# Patient Record
Sex: Female | Born: 1965 | Race: White | Hispanic: No | Marital: Married | Smoking: Current every day smoker
Health system: Southern US, Community
[De-identification: ages and names within clinical notes are randomized; demographics above are authoritative.]

## PROBLEM LIST (undated history)

## (undated) DIAGNOSIS — T8859XA Other complications of anesthesia, initial encounter: Secondary | ICD-10-CM

## (undated) DIAGNOSIS — Z9889 Other specified postprocedural states: Secondary | ICD-10-CM

## (undated) DIAGNOSIS — R112 Nausea with vomiting, unspecified: Secondary | ICD-10-CM

## (undated) DIAGNOSIS — T4145XA Adverse effect of unspecified anesthetic, initial encounter: Secondary | ICD-10-CM

## (undated) DIAGNOSIS — K5090 Crohn's disease, unspecified, without complications: Secondary | ICD-10-CM

## (undated) DIAGNOSIS — F909 Attention-deficit hyperactivity disorder, unspecified type: Secondary | ICD-10-CM

## (undated) HISTORY — PX: ABDOMINAL SURGERY: SHX537

---
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
## 2004-02-14 ENCOUNTER — Encounter: Admission: RE | Admit: 2004-02-14 | Discharge: 2004-02-14 | Payer: Self-pay | Admitting: Gastroenterology

## 2004-08-12 ENCOUNTER — Inpatient Hospital Stay (HOSPITAL_COMMUNITY): Admission: AD | Admit: 2004-08-12 | Discharge: 2004-08-15 | Payer: Self-pay | Admitting: Gastroenterology

## 2004-09-14 ENCOUNTER — Ambulatory Visit (HOSPITAL_COMMUNITY): Admission: RE | Admit: 2004-09-14 | Discharge: 2004-09-14 | Payer: Self-pay | Admitting: Gastroenterology

## 2004-09-25 ENCOUNTER — Ambulatory Visit (HOSPITAL_COMMUNITY): Admission: RE | Admit: 2004-09-25 | Discharge: 2004-09-25 | Payer: Self-pay | Admitting: Gastroenterology

## 2004-10-09 ENCOUNTER — Encounter (HOSPITAL_COMMUNITY): Admission: RE | Admit: 2004-10-09 | Discharge: 2005-01-07 | Payer: Self-pay | Admitting: Gastroenterology

## 2005-01-19 ENCOUNTER — Inpatient Hospital Stay (HOSPITAL_COMMUNITY): Admission: RE | Admit: 2005-01-19 | Discharge: 2005-01-25 | Payer: Self-pay | Admitting: General Surgery

## 2019-04-24 DIAGNOSIS — K56609 Unspecified intestinal obstruction, unspecified as to partial versus complete obstruction: Secondary | ICD-10-CM

## 2019-04-24 HISTORY — DX: Unspecified intestinal obstruction, unspecified as to partial versus complete obstruction: K56.609

## 2019-05-06 ENCOUNTER — Inpatient Hospital Stay (HOSPITAL_COMMUNITY)
Admission: EM | Admit: 2019-05-06 | Discharge: 2019-05-10 | DRG: 387 | Disposition: A | Discharge status: Home / Self Care | Payer: BLUE CROSS/BLUE SHIELD | Attending: Family Medicine | Admitting: Family Medicine

## 2019-05-06 ENCOUNTER — Other Ambulatory Visit: Payer: Self-pay

## 2019-05-06 ENCOUNTER — Encounter (HOSPITAL_COMMUNITY): Payer: Self-pay | Admitting: *Deleted

## 2019-05-06 DIAGNOSIS — E876 Hypokalemia: Secondary | ICD-10-CM | POA: Diagnosis present

## 2019-05-06 DIAGNOSIS — Z20828 Contact with and (suspected) exposure to other viral communicable diseases: Secondary | ICD-10-CM | POA: Diagnosis present

## 2019-05-06 DIAGNOSIS — K56609 Unspecified intestinal obstruction, unspecified as to partial versus complete obstruction: Secondary | ICD-10-CM

## 2019-05-06 DIAGNOSIS — R1011 Right upper quadrant pain: Secondary | ICD-10-CM | POA: Diagnosis not present

## 2019-05-06 DIAGNOSIS — F17210 Nicotine dependence, cigarettes, uncomplicated: Secondary | ICD-10-CM | POA: Diagnosis present

## 2019-05-06 DIAGNOSIS — K50012 Crohn's disease of small intestine with intestinal obstruction: Principal | ICD-10-CM | POA: Diagnosis present

## 2019-05-06 DIAGNOSIS — Z9049 Acquired absence of other specified parts of digestive tract: Secondary | ICD-10-CM

## 2019-05-06 HISTORY — DX: Crohn's disease, unspecified, without complications: K50.90

## 2019-05-06 HISTORY — DX: Attention-deficit hyperactivity disorder, unspecified type: F90.9

## 2019-05-06 HISTORY — DX: Nausea with vomiting, unspecified: R11.2

## 2019-05-06 HISTORY — DX: Other specified postprocedural states: Z98.890

## 2019-05-06 HISTORY — DX: Other complications of anesthesia, initial encounter: T88.59XA

## 2019-05-06 LAB — COMPREHENSIVE METABOLIC PANEL
ALT: 12 U/L (ref 0–44)
AST: 17 U/L (ref 15–41)
Albumin: 3.3 g/dL — ABNORMAL LOW (ref 3.5–5.0)
Alkaline Phosphatase: 70 U/L (ref 38–126)
Anion gap: 13 (ref 5–15)
BUN: 13 mg/dL (ref 6–20)
CO2: 22 mmol/L (ref 22–32)
Calcium: 8.4 mg/dL — ABNORMAL LOW (ref 8.9–10.3)
Chloride: 104 mmol/L (ref 98–111)
Creatinine, Ser: 0.73 mg/dL (ref 0.44–1.00)
GFR calc Af Amer: >60 mL/min (ref >60)
GFR calc non Af Amer: >60 mL/min (ref >60)
Glucose, Bld: 158 mg/dL — ABNORMAL HIGH (ref 70–99)
Potassium: 2.8 mmol/L — ABNORMAL LOW (ref 3.5–5.1)
Sodium: 139 mmol/L (ref 135–145)
Total Bilirubin: 1 mg/dL (ref 0.3–1.2)
Total Protein: 6.8 g/dL (ref 6.5–8.1)

## 2019-05-06 LAB — CBC
HCT: 39.9 % (ref 36.0–46.0)
Hemoglobin: 13.1 g/dL (ref 12.0–15.0)
MCH: 29.8 pg (ref 26.0–34.0)
MCHC: 32.8 g/dL (ref 30.0–36.0)
MCV: 90.9 fL (ref 80.0–100.0)
Platelets: 372 10*3/uL (ref 150–400)
RBC: 4.39 MIL/uL (ref 3.87–5.11)
RDW: 13.2 % (ref 11.5–15.5)
WBC: 24.8 10*3/uL — ABNORMAL HIGH (ref 4.0–10.5)
nRBC: 0 % (ref 0.0–0.2)

## 2019-05-06 LAB — LIPASE, BLOOD: Lipase: 21 U/L (ref 11–51)

## 2019-05-06 MED ORDER — SODIUM CHLORIDE 0.9% FLUSH
3.0000 mL | Freq: Once | INTRAVENOUS | Status: AC
  Administered 2019-05-07: 01:00:00 3 mL via INTRAVENOUS

## 2019-05-06 NOTE — ED Notes (Signed)
Pt brought in by EMS after family called due to abdominal pain, N/V and diarrhea which pt has had since 7am today (pt had a similar episode last week and was not seen for this and it resolved).  Pt is reporting 10/10 abdominal pain.  Pt had 4mg  zofran pta by ems, she has 18g IV in RAC and had 522ml NS from ems. Pt niece followed ems in and wants to ensure we are aware that pt took Tizanadine HCL 4mg  at 1pm and another 6mg  at 4pm< these are her sons medication in an attempt to control her abdominal pain.  Niece also wanted to make sure we are aware that Pt also stopped taking adderall about a month ago.

## 2019-05-06 NOTE — ED Triage Notes (Signed)
Pt with hx of crohns here for abdominal pain which began this am.  No fever, she states that she has vomited x4 today, she has had some diarrhea today x2.  Pt states that she had abdominal pain last week and resolved on its own in 3 days.  Pt states that her last crohns flare up was around 20 years ago and pt is not on any meds for this.  Pt appears in no acute distress in triage.

## 2019-05-06 NOTE — ED Notes (Signed)
Patient's niece # for update [phone]

## 2019-05-07 ENCOUNTER — Inpatient Hospital Stay (HOSPITAL_COMMUNITY): Payer: BLUE CROSS/BLUE SHIELD

## 2019-05-07 ENCOUNTER — Encounter (HOSPITAL_COMMUNITY): Payer: Self-pay | Admitting: Internal Medicine

## 2019-05-07 ENCOUNTER — Emergency Department (HOSPITAL_COMMUNITY): Payer: BLUE CROSS/BLUE SHIELD

## 2019-05-07 DIAGNOSIS — E876 Hypokalemia: Secondary | ICD-10-CM | POA: Diagnosis present

## 2019-05-07 DIAGNOSIS — R1011 Right upper quadrant pain: Secondary | ICD-10-CM | POA: Diagnosis present

## 2019-05-07 DIAGNOSIS — Z20828 Contact with and (suspected) exposure to other viral communicable diseases: Secondary | ICD-10-CM | POA: Diagnosis present

## 2019-05-07 DIAGNOSIS — K56609 Unspecified intestinal obstruction, unspecified as to partial versus complete obstruction: Secondary | ICD-10-CM | POA: Diagnosis present

## 2019-05-07 DIAGNOSIS — K50012 Crohn's disease of small intestine with intestinal obstruction: Secondary | ICD-10-CM | POA: Diagnosis present

## 2019-05-07 DIAGNOSIS — F17210 Nicotine dependence, cigarettes, uncomplicated: Secondary | ICD-10-CM | POA: Diagnosis present

## 2019-05-07 DIAGNOSIS — Z9049 Acquired absence of other specified parts of digestive tract: Secondary | ICD-10-CM | POA: Diagnosis not present

## 2019-05-07 LAB — COMPREHENSIVE METABOLIC PANEL
ALT: 12 U/L (ref 0–44)
AST: 14 U/L — ABNORMAL LOW (ref 15–41)
Albumin: 2.9 g/dL — ABNORMAL LOW (ref 3.5–5.0)
Alkaline Phosphatase: 69 U/L (ref 38–126)
Anion gap: 12 (ref 5–15)
BUN: 16 mg/dL (ref 6–20)
CO2: 24 mmol/L (ref 22–32)
Calcium: 8 mg/dL — ABNORMAL LOW (ref 8.9–10.3)
Chloride: 105 mmol/L (ref 98–111)
Creatinine, Ser: 0.79 mg/dL (ref 0.44–1.00)
GFR calc Af Amer: >60 mL/min (ref >60)
GFR calc non Af Amer: >60 mL/min (ref >60)
Glucose, Bld: 174 mg/dL — ABNORMAL HIGH (ref 70–99)
Potassium: 3.4 mmol/L — ABNORMAL LOW (ref 3.5–5.1)
Sodium: 141 mmol/L (ref 135–145)
Total Bilirubin: 0.3 mg/dL (ref 0.3–1.2)
Total Protein: 6.2 g/dL — ABNORMAL LOW (ref 6.5–8.1)

## 2019-05-07 LAB — CBC WITH DIFFERENTIAL/PLATELET
Abs Immature Granulocytes: 0.08 10*3/uL — ABNORMAL HIGH (ref 0.00–0.07)
Basophils Absolute: 0.1 10*3/uL (ref 0.0–0.1)
Basophils Relative: 0 %
Eosinophils Absolute: 0 10*3/uL (ref 0.0–0.5)
Eosinophils Relative: 0 %
HCT: 35.9 % — ABNORMAL LOW (ref 36.0–46.0)
Hemoglobin: 12.1 g/dL (ref 12.0–15.0)
Immature Granulocytes: 1 %
Lymphocytes Relative: 3 %
Lymphs Abs: 0.6 10*3/uL — ABNORMAL LOW (ref 0.7–4.0)
MCH: 29.7 pg (ref 26.0–34.0)
MCHC: 33.7 g/dL (ref 30.0–36.0)
MCV: 88.2 fL (ref 80.0–100.0)
Monocytes Absolute: 0.2 10*3/uL (ref 0.1–1.0)
Monocytes Relative: 1 %
Neutro Abs: 15.7 10*3/uL — ABNORMAL HIGH (ref 1.7–7.7)
Neutrophils Relative %: 95 %
Platelets: 329 10*3/uL (ref 150–400)
RBC: 4.07 MIL/uL (ref 3.87–5.11)
RDW: 13.1 % (ref 11.5–15.5)
WBC: 16.5 10*3/uL — ABNORMAL HIGH (ref 4.0–10.5)
nRBC: 0 % (ref 0.0–0.2)

## 2019-05-07 LAB — HEMOGLOBIN A1C
Hgb A1c MFr Bld: 6.3 % — ABNORMAL HIGH (ref 4.8–5.6)
Mean Plasma Glucose: 134.11 mg/dL

## 2019-05-07 LAB — URINALYSIS, ROUTINE W REFLEX MICROSCOPIC
Bilirubin Urine: NEGATIVE
Glucose, UA: NEGATIVE mg/dL
Hgb urine dipstick: NEGATIVE
Ketones, ur: 20 mg/dL — AB
Nitrite: NEGATIVE
Protein, ur: 30 mg/dL — AB
Specific Gravity, Urine: >1.046 — ABNORMAL HIGH (ref 1.005–1.030)
pH: 5 (ref 5.0–8.0)

## 2019-05-07 LAB — CBC
HCT: 36.1 % (ref 36.0–46.0)
Hemoglobin: 12.1 g/dL (ref 12.0–15.0)
MCH: 30.3 pg (ref 26.0–34.0)
MCHC: 33.5 g/dL (ref 30.0–36.0)
MCV: 90.3 fL (ref 80.0–100.0)
Platelets: 344 10*3/uL (ref 150–400)
RBC: 4 MIL/uL (ref 3.87–5.11)
RDW: 13.2 % (ref 11.5–15.5)
WBC: 18.4 10*3/uL — ABNORMAL HIGH (ref 4.0–10.5)
nRBC: 0 % (ref 0.0–0.2)

## 2019-05-07 LAB — CREATININE, SERUM
Creatinine, Ser: 0.63 mg/dL (ref 0.44–1.00)
GFR calc Af Amer: >60 mL/min (ref >60)
GFR calc non Af Amer: >60 mL/min (ref >60)

## 2019-05-07 LAB — GLUCOSE, CAPILLARY
Glucose-Capillary: 157 mg/dL — ABNORMAL HIGH (ref 70–99)
Glucose-Capillary: 137 mg/dL — ABNORMAL HIGH (ref 70–99)
Glucose-Capillary: 131 mg/dL — ABNORMAL HIGH (ref 70–99)

## 2019-05-07 LAB — SARS CORONAVIRUS 2 (TAT 6-24 HRS): SARS Coronavirus 2: NEGATIVE

## 2019-05-07 LAB — I-STAT BETA HCG BLOOD, ED (MC, WL, AP ONLY): I-stat hCG, quantitative: <5 m[IU]/mL (ref <5)

## 2019-05-07 LAB — HIV ANTIBODY (ROUTINE TESTING W REFLEX): HIV Screen 4th Generation wRfx: NONREACTIVE

## 2019-05-07 LAB — MAGNESIUM: Magnesium: 2.1 mg/dL (ref 1.7–2.4)

## 2019-05-07 MED ORDER — PHENOL 1.4 % MT LIQD
1.0000 | OROMUCOSAL | Status: DC | PRN
  Filled 2019-05-07: qty 177

## 2019-05-07 MED ORDER — HYDROMORPHONE HCL 1 MG/ML IJ SOLN
1.0000 mg | Freq: Once | INTRAMUSCULAR | Status: AC
  Administered 2019-05-07: 1 mg via INTRAVENOUS
  Filled 2019-05-07: qty 1

## 2019-05-07 MED ORDER — POTASSIUM CHLORIDE 10 MEQ/100ML IV SOLN
10.0000 meq | INTRAVENOUS | Status: AC
  Administered 2019-05-07 (×2): 10 meq via INTRAVENOUS
  Filled 2019-05-07 (×2): qty 100

## 2019-05-07 MED ORDER — LIDOCAINE VISCOUS HCL 2 % MT SOLN
15.0000 mL | Freq: Once | OROMUCOSAL | Status: AC
  Administered 2019-05-07: 03:00:00 15 mL via OROMUCOSAL
  Filled 2019-05-07: qty 15

## 2019-05-07 MED ORDER — METHYLPREDNISOLONE SODIUM SUCC 125 MG IJ SOLR
125.0000 mg | Freq: Once | INTRAMUSCULAR | Status: AC
  Administered 2019-05-07: 125 mg via INTRAVENOUS
  Filled 2019-05-07: qty 2

## 2019-05-07 MED ORDER — HEPARIN SODIUM (PORCINE) 5000 UNIT/ML IJ SOLN
5000.0000 [IU] | Freq: Three times a day (TID) | INTRAMUSCULAR | Status: DC
  Administered 2019-05-07 – 2019-05-08 (×3): 5000 [IU] via SUBCUTANEOUS
  Filled 2019-05-07 (×4): qty 1

## 2019-05-07 MED ORDER — ONDANSETRON HCL 4 MG PO TABS: 4.0000 mg | ORAL_TABLET | Freq: Four times a day (QID) | ORAL | Status: DC | PRN

## 2019-05-07 MED ORDER — IOHEXOL 300 MG/ML  SOLN
100.0000 mL | Freq: Once | INTRAMUSCULAR | Status: AC | PRN
  Administered 2019-05-07: 100 mL via INTRAVENOUS

## 2019-05-07 MED ORDER — METOCLOPRAMIDE HCL 5 MG/ML IJ SOLN
10.0000 mg | INTRAMUSCULAR | Status: AC
Start: 2019-05-07 — End: 2019-05-07
  Administered 2019-05-07: 10 mg via INTRAVENOUS
  Filled 2019-05-07: qty 2

## 2019-05-07 MED ORDER — METHYLPREDNISOLONE SODIUM SUCC 40 MG IJ SOLR
40.0000 mg | Freq: Every day | INTRAMUSCULAR | Status: DC
  Administered 2019-05-07 – 2019-05-09 (×3): 40 mg via INTRAVENOUS
  Filled 2019-05-07 (×3): qty 1

## 2019-05-07 MED ORDER — ACETAMINOPHEN 650 MG RE SUPP: 650.0000 mg | Freq: Four times a day (QID) | RECTAL | Status: DC | PRN

## 2019-05-07 MED ORDER — HYDROMORPHONE HCL 1 MG/ML IJ SOLN
0.5000 mg | INTRAMUSCULAR | Status: DC | PRN
  Administered 2019-05-07 – 2019-05-08 (×3): 0.5 mg via INTRAVENOUS
  Filled 2019-05-07 (×3): qty 1

## 2019-05-07 MED ORDER — POTASSIUM CL IN DEXTROSE 5% 20 MEQ/L IV SOLN
20.0000 meq | INTRAVENOUS | Status: AC
  Administered 2019-05-07 (×2): 20 meq via INTRAVENOUS
  Filled 2019-05-07 (×2): qty 1000

## 2019-05-07 MED ORDER — ACETAMINOPHEN 325 MG PO TABS: 650.0000 mg | ORAL_TABLET | Freq: Four times a day (QID) | ORAL | Status: DC | PRN

## 2019-05-07 MED ORDER — ONDANSETRON HCL 4 MG/2ML IJ SOLN
4.0000 mg | Freq: Four times a day (QID) | INTRAMUSCULAR | Status: DC | PRN
  Administered 2019-05-07: 05:00:00 4 mg via INTRAVENOUS
  Filled 2019-05-07: qty 2

## 2019-05-07 MED ORDER — HEPARIN SODIUM (PORCINE) 5000 UNIT/ML IJ SOLN
5000.0000 [IU] | Freq: Three times a day (TID) | INTRAMUSCULAR | Status: DC
  Administered 2019-05-07: 5000 [IU] via SUBCUTANEOUS
  Filled 2019-05-07: qty 1

## 2019-05-07 MED ORDER — FAMOTIDINE IN NACL 20-0.9 MG/50ML-% IV SOLN
20.0000 mg | INTRAVENOUS | Status: AC
  Administered 2019-05-07: 01:00:00 20 mg via INTRAVENOUS
  Filled 2019-05-07: qty 50

## 2019-05-07 MED ORDER — SODIUM CHLORIDE 0.9 % IV BOLUS
1000.0000 mL | Freq: Once | INTRAVENOUS | Status: AC
  Administered 2019-05-07: 1000 mL via INTRAVENOUS

## 2019-05-07 NOTE — H&P (Signed)
History and Physical    Holly Stevens JHE:[id number] DOB: [birth year] DOA: 05/06/2019  PCP: Nonnie Done., MD  Patient coming from: Home.  Chief Complaint: Abdominal pain nausea vomiting.  HPI: Holly Stevens is a 53 y.o. female with history of Crohn's disease status post resection about 25 years ago exact details of her surgery is not clear we do not have any access to her records likely in [facility] presents to the ER because of abdominal pain.  Pain is mostly around the lower quadrants.  Colicky nature worsening as time proceeded.  Had at least 4 episodes of vomiting no blood in it.  Patient stated last week patient had a similar episode which lasted for 3 days when patient also had diarrhea.  Prior to coming the ER patient had a bowel movement which was loose.  No blood in it.  ED Course: In the ER patient had CT abdomen pelvis which shows features concerning for small bowel obstruction with transition point concerning for inflammatory changes consistent with Crohn's disease.  ER physician I placed patient on IV Solu-Medrol for likely Crohn's disease and NG tube for obstruction.  Labs are remarkable for hypokalemia and leukocytosis.  Covid 19 test is pending.  Patient was started on fluids pain medication admitted for further management.  Review of Systems: As per HPI, rest all negative.   Past Medical History:  Diagnosis Date  . ADHD   . Crohn's disease [facility])     Past Surgical History:  Procedure Laterality Date  . ABDOMINAL SURGERY       reports that she has been smoking cigarettes. She has a 16.50 pack-year smoking history. She has never used smokeless tobacco. She reports previous alcohol use. She reports that she does not use drugs.  No Known Allergies  Family History  Family history unknown: Yes    Prior to Admission medications   Not on File    Physical Exam: Constitutional: Moderately built and nourished. Vitals:   05/07/19 0200 05/07/19 0215 05/07/19  0230 05/07/19 0245  BP: 109/68 114/67 116/64 125/69  Pulse: 84 83 84 85  Resp: 19 15 16 18   Temp:      TempSrc:      SpO2: 94% 94% 97% 96%  Weight:       Eyes: Anicteric no pallor. ENMT: No discharge from the ears eyes nose or mouth. Neck: No mass.  No neck rigidity. Respiratory: No rhonchi or crepitations. Cardiovascular: S1-S2 heard. Abdomen: Soft mild distention bowel sounds not appreciated no guarding or rigidity. Musculoskeletal: No edema. Skin: No rash. Neurologic: Alert awake oriented to time place and person.  Moves all extremities. Psychiatric: Appears normal per normal affect.   Labs on Admission: I have personally reviewed following labs and imaging studies  CBC: Recent Labs  Lab 05/06/19 2223  WBC 24.8*  HGB 13.1  HCT 39.9  MCV 90.9  PLT 372   Basic Metabolic Panel: Recent Labs  Lab 05/06/19 2223  NA 139  K 2.8*  CL 104  CO2 22  GLUCOSE 158*  BUN 13  CREATININE 0.73  CALCIUM 8.4*   GFR: CrCl cannot be calculated (Unknown ideal weight.). Liver Function Tests: Recent Labs  Lab 05/06/19 2223  AST 17  ALT 12  ALKPHOS 70  BILITOT 1.0  PROT 6.8  ALBUMIN 3.3*   Recent Labs  Lab 05/06/19 2223  LIPASE 21   No results for input(s): AMMONIA in the last 168 hours. Coagulation Profile: No results for input(s): INR,  PROTIME in the last 168 hours. Cardiac Enzymes: No results for input(s): CKTOTAL, CKMB, CKMBINDEX, TROPONINI in the last 168 hours. BNP (last 3 results) No results for input(s): PROBNP in the last 8760 hours. HbA1C: No results for input(s): HGBA1C in the last 72 hours. CBG: No results for input(s): GLUCAP in the last 168 hours. Lipid Profile: No results for input(s): CHOL, HDL, LDLCALC, TRIG, CHOLHDL, LDLDIRECT in the last 72 hours. Thyroid Function Tests: No results for input(s): TSH, T4TOTAL, FREET4, T3FREE, THYROIDAB in the last 72 hours. Anemia Panel: No results for input(s): VITAMINB12, FOLATE, FERRITIN, TIBC, IRON,  RETICCTPCT in the last 72 hours. Urine analysis: No results found for: COLORURINE, APPEARANCEUR, LABSPEC, PHURINE, GLUCOSEU, HGBUR, BILIRUBINUR, KETONESUR, PROTEINUR, UROBILINOGEN, NITRITE, LEUKOCYTESUR Sepsis Labs: @LABRCNTIP (procalcitonin:4,lacticidven:4) )No results found for this or any previous visit (from the past 240 hour(s)).   Radiological Exams on Admission: CT ABDOMEN PELVIS W CONTRAST  Result Date: 05/07/2019 CLINICAL DATA:  Abdominal pain. History of Crohn's disease and abdominal surgery. EXAM: CT ABDOMEN AND PELVIS WITH CONTRAST TECHNIQUE: Multidetector CT imaging of the abdomen and pelvis was performed using the standard protocol following bolus administration of intravenous contrast. CONTRAST:  100mL OMNIPAQUE IOHEXOL 300 MG/ML  SOLN COMPARISON:  August 12, 2012 FINDINGS: Lower chest: The lung bases are clear. The heart size is normal. Hepatobiliary: The liver is normal. Normal gallbladder.There is no biliary ductal dilation. Pancreas: Normal contours without ductal dilatation. No peripancreatic fluid collection. Spleen: No splenic laceration or hematoma. Adrenals/Urinary Tract: --Adrenal glands: No adrenal hemorrhage. --Right kidney/ureter: No hydronephrosis or perinephric hematoma. --Left kidney/ureter: No hydronephrosis or perinephric hematoma. --Urinary bladder: Unremarkable. Stomach/Bowel: --Stomach/Duodenum: No hiatal hernia or other gastric abnormality. Normal duodenal course and caliber. --Small bowel: There are multiple dilated loops of small bowel measuring up to approximately 5.2 cm. There is a transition point in the midline abdomen at the level of the patient's neoterminal ileum. At this level, there is diffuse small bowel wall thickening with adjacent inflammation. --Colon: No focal abnormality. --Appendix: Surgically absent. Vascular/Lymphatic: Atherosclerotic calcification is present within the non-aneurysmal abdominal aorta, without hemodynamically significant stenosis.  --No retroperitoneal lymphadenopathy. --No mesenteric lymphadenopathy. --No pelvic or inguinal lymphadenopathy. Reproductive: Unremarkable Other: No ascites or free air. The abdominal wall is normal. Musculoskeletal. No acute displaced fractures. IMPRESSION: Small-bowel obstruction with transition point at the level of the patient's neoterminal ileum. At this level, there is diffuse small bowel wall thickening with adjacent inflammation, consistent with active Crohn's disease. Aortic Atherosclerosis (ICD10-I70.0). Electronically Signed   By: Katherine Mantlehristopher  Green M.D.   On: 05/07/2019 01:23   DG Abd Portable 1 View  Result Date: 05/07/2019 CLINICAL DATA:  Initial evaluation for NG tube placement. EXAM: PORTABLE ABDOMEN - 1 VIEW COMPARISON:  Prior CT from earlier the same day. FINDINGS: Enteric tube in place, seen coiled within the stomach, tip overlying the gastric antrum. Side hole well beyond the GE junction. Secreted IV contrast material seen within the renal collecting systems bilaterally. IMPRESSION: Enteric tube in place, tip overlying the gastric antrum. Side hole well beyond the GE junction. Electronically Signed   By: Rise MuBenjamin  McClintock M.D.   On: 05/07/2019 03:00     Assessment/Plan Principal Problem:   SBO (small bowel obstruction) (HCC) Active Problems:   Hypokalemia    1. Small bowel obstruction with history of Crohn's disease likely cause for the obstruction.  Patient received IV Solu-Medrol in the ER and I have placed patient on daily dose of Solu-Medrol IV.  An NG tube.  Repeat KUB in  the morning.  Consult gastroenterologist in the morning.  We will also ask general surgery's opinion. 2. Hypokalemia likely from vomiting replace recheck.  Check magnesium with next blood draw. 3. Leukocytosis likely reactionary.  Follow closely.  Patient afebrile.  COVID-19 test is pending.  Given that patient is having small bowel obstruction will need more than 2 midnight stay and inpatient  status.   DVT prophylaxis: SCDs and heparin. Code Status: Full code. Family Communication: Discussed with patient. Disposition Plan: Home. Consults called: None. Admission status: Inpatient.   Rise Patience MD [facility] Pager [phone].  If 7PM-7AM, please contact night-coverage [email or web address] Password [email or web address]  05/07/2019, 3:07 AM

## 2019-05-07 NOTE — ED Notes (Signed)
Mouth swab and 1/4 cup of water given to pt to moistened mouth

## 2019-05-07 NOTE — Consult Note (Signed)
[facility] Consult Note  Holly Stevens 1965/06/17  [id number].    Requesting MD: Mir Conan Bowens Chief Complaint/Reason for Consult: SBO  HPI:  Holly Stevens is a 53yo female PMH Crohns not currently on any medication, who was admitted to [facility] yesterday complaining of abdominal pain. States that about 1 week ago she experienced intermittent abdominal pain for a few days that resolved without intervention. Her pain returned yesterday morning and was severe. The pain is mostly on the right side of her abdomen. Associated with multiple episodes of nausea and vomiting. Last BM was yesterday morning, loose, non-bloody. She has not passed any flatus today. Denies fever, chills, dysuria.   ED workup included CT scan which shows SBO with transition point at the level of the patient's neoterminal ileum; also at this level there is diffuse small bowel wall thickening with adjacent inflammation consistent with active Crohn's disease. WBC 24.8, K 2.8.   Patient was admitted to the medical service. NG tube was placed. She was started on IV solu-medrol, gastroenterology consult pending. General surgery also asked to see.  Of note, patient was previously seen and treated by Dr. Evette Cristal for her Crohns, but she has not seen him in about 20-25 years. She has not been on any Crohns medications since that time. She thinks her last colonoscopy was ~10 years ago.  -Abdominal surgical history: ex lap ileocecectomy ~1996, ex lap small bowel resection for obstruction ~1997 -Anticoagulants: none -Smokes <1 PPD -Lives at home with husband and son -Not currently working  ROS: Review of Systems  Constitutional: Negative.   HENT: Negative.   Eyes: Negative.   Respiratory: Negative.   Cardiovascular: Negative.   Gastrointestinal: Positive for abdominal pain, diarrhea, nausea and vomiting. Negative for blood in stool and constipation.  Genitourinary: Negative.   Musculoskeletal: Negative.    Skin: Negative.   Neurological: Negative.    All systems reviewed and otherwise negative except for as above  Family History  Family history unknown: Yes    Past Medical History:  Diagnosis Date  . ADHD   . Crohn's disease [facility])     Past Surgical History:  Procedure Laterality Date  . ABDOMINAL SURGERY      Social History:  reports that she has been smoking cigarettes. She has a 16.50 pack-year smoking history. She has never used smokeless tobacco. She reports previous alcohol use. She reports that she does not use drugs.  Allergies: No Known Allergies  No medications prior to admission.    Prior to Admission medications   Not on File    Blood pressure 119/70, pulse 77, temperature 97.9 F (36.6 C), temperature source Oral, resp. rate 18, weight 71.9 kg, SpO2 97 %. Physical Exam: General: pleasant, WD/WN white female who is laying in bed in NAD HEENT: head is normocephalic, atraumatic.  Sclera are noninjected.  Pupils equal and round.  Ears and nose without any masses or lesions.  Mouth is pink and moist. Dentition fair Heart: regular, rate, and rhythm.  No obvious murmurs, gallops, or rubs noted.  Palpable pedal pulses bilaterally Lungs: CTAB, no wheezes, rhonchi, or rales noted.  Respiratory effort nonlabored Abd: well healed midline incision, soft, mild distension, hypoactive BS, no masses, hernias, or organomegaly. Mild right sided abdominal TTP without rebound or guarding MS: calves soft and nontender without edema Skin: warm and dry with no masses, lesions, or rashes Psych: A&Ox3 with an appropriate affect. Neuro: cranial nerves grossly intact, extremity CSM intact bilaterally, normal speech  Results  for orders placed or performed during the hospital encounter of 05/06/19 (from the past 48 hour(s))  Lipase, blood     Status: None   Collection Time: 05/06/19 10:23 PM  Result Value Ref Range   Lipase 21 11 - 51 U/L    Comment: Performed at [facility]  Lab, [address] [place], [place] [address]  Comprehensive metabolic panel     Status: Abnormal   Collection Time: 05/06/19 10:23 PM  Result Value Ref Range   Sodium 139 135 - 145 mmol/L   Potassium 2.8 (L) 3.5 - 5.1 mmol/L   Chloride 104 98 - 111 mmol/L   CO2 22 22 - 32 mmol/L   Glucose, Bld 158 (H) 70 - 99 mg/dL   BUN 13 6 - 20 mg/dL   Creatinine, Ser 8.18 0.44 - 1.00 mg/dL   Calcium 8.4 (L) 8.9 - 10.3 mg/dL   Total Protein 6.8 6.5 - 8.1 g/dL   Albumin 3.3 (L) 3.5 - 5.0 g/dL   AST 17 15 - 41 U/L   ALT 12 0 - 44 U/L   Alkaline Phosphatase 70 38 - 126 U/L   Total Bilirubin 1.0 0.3 - 1.2 mg/dL   GFR calc non Af Amer >60 >60 mL/min   GFR calc Af Amer >60 >60 mL/min   Anion gap 13 5 - 15    Comment: Performed at [facility], [address], [place], [place] [address]  CBC     Status: Abnormal   Collection Time: 05/06/19 10:23 PM  Result Value Ref Range   WBC 24.8 (H) 4.0 - 10.5 K/uL   RBC 4.39 3.87 - 5.11 MIL/uL   Hemoglobin 13.1 12.0 - 15.0 g/dL   HCT 97.0 26.3 - 78.5 %   MCV 90.9 80.0 - 100.0 fL   MCH 29.8 26.0 - 34.0 pg   MCHC 32.8 30.0 - 36.0 g/dL   RDW 88.5 02.7 - 74.1 %   Platelets 372 150 - 400 K/uL   nRBC 0.0 0.0 - 0.2 %    Comment: Performed at [facility], [address] [place], [place] [address]  I-Stat beta hCG blood, ED     Status: None   Collection Time: 05/07/19 12:42 AM  Result Value Ref Range   I-stat hCG, quantitative <5.0 <5 mIU/mL   Comment 3            Comment:   GEST. AGE      CONC.  (mIU/mL)   <=1 WEEK        5 - 50     2 WEEKS       50 - 500     3 WEEKS       100 - 10,000     4 WEEKS     1,000 - 30,000        FEMALE AND NON-PREGNANT FEMALE:     LESS THAN 5 mIU/mL   SARS CORONAVIRUS 2 (TAT 6-24 HRS) Nasopharyngeal Nasopharyngeal Swab     Status: None   Collection Time: 05/07/19  2:01 AM   Specimen: Nasopharyngeal Swab  Result Value Ref Range   SARS Coronavirus 2 NEGATIVE NEGATIVE    Comment: (NOTE) SARS-CoV-2 target nucleic  acids are NOT DETECTED. The SARS-CoV-2 RNA is generally detectable in upper and lower respiratory specimens during the acute phase of infection. Negative results do not preclude SARS-CoV-2 infection, do not rule out co-infections with other pathogens, and should not be used as the sole  basis for treatment or other patient management decisions. Negative results must be combined with clinical observations, patient history, and epidemiological information. The expected result is Negative. Fact Sheet for Patients: HairSlick.nohttps://www.fda.gov/media/138098/download Fact Sheet for Healthcare Providers: quierodirigir.comhttps://www.fda.gov/media/138095/download This test is not yet approved or cleared by the [place] FDA and  has been authorized for detection and/or diagnosis of SARS-CoV-2 by FDA under an Emergency Use Authorization (EUA). This EUA will remain  in effect (meaning this test can be used) for the duration of the COVID-19 declaration under Section 56 4(b)(1) of the Act, 21 U.S.C. section 360bbb-3(b)(1), unless the authorization is terminated or revoked sooner. Performed at [facility], [address] [place], [place] [address]   CBC     Status: Abnormal   Collection Time: 05/07/19  3:05 AM  Result Value Ref Range   WBC 18.4 (H) 4.0 - 10.5 K/uL   RBC 4.00 3.87 - 5.11 MIL/uL   Hemoglobin 12.1 12.0 - 15.0 g/dL   HCT 60.436.1 54.036.0 - 98.146.0 %   MCV 90.3 80.0 - 100.0 fL   MCH 30.3 26.0 - 34.0 pg   MCHC 33.5 30.0 - 36.0 g/dL   RDW 19.113.2 47.811.5 - 29.515.5 %   Platelets 344 150 - 400 K/uL   nRBC 0.0 0.0 - 0.2 %    Comment: Performed at [facility], [address] [place], [place] [address]  Creatinine, serum     Status: None   Collection Time: 05/07/19  3:05 AM  Result Value Ref Range   Creatinine, Ser 0.63 0.44 - 1.00 mg/dL   GFR calc non Af Amer >60 >60 mL/min   GFR calc Af Amer >60 >60 mL/min    Comment: Performed at [facility], [address] [place], [place] [address]  Glucose,  capillary     Status: Abnormal   Collection Time: 05/07/19  6:09 AM  Result Value Ref Range   Glucose-Capillary 157 (H) 70 - 99 mg/dL  Comprehensive metabolic panel     Status: Abnormal   Collection Time: 05/07/19  6:34 AM  Result Value Ref Range   Sodium 141 135 - 145 mmol/L   Potassium 3.4 (L) 3.5 - 5.1 mmol/L   Chloride 105 98 - 111 mmol/L   CO2 24 22 - 32 mmol/L   Glucose, Bld 174 (H) 70 - 99 mg/dL   BUN 16 6 - 20 mg/dL   Creatinine, Ser 4.690.79 0.44 - 1.00 mg/dL   Calcium 8.0 (L) 8.9 - 10.3 mg/dL   Total Protein 6.2 (L) 6.5 - 8.1 g/dL   Albumin 2.9 (L) 3.5 - 5.0 g/dL   AST 14 (L) 15 - 41 U/L   ALT 12 0 - 44 U/L   Alkaline Phosphatase 69 38 - 126 U/L   Total Bilirubin 0.3 0.3 - 1.2 mg/dL   GFR calc non Af Amer >60 >60 mL/min   GFR calc Af Amer >60 >60 mL/min   Anion gap 12 5 - 15    Comment: Performed at [facility], [address] [place], [place] [address]  CBC with Differential/Platelet     Status: Abnormal   Collection Time: 05/07/19  6:34 AM  Result Value Ref Range   WBC 16.5 (H) 4.0 - 10.5 K/uL   RBC 4.07 3.87 - 5.11 MIL/uL   Hemoglobin 12.1 12.0 - 15.0 g/dL   HCT 84.135.9 (L) 32.436.0 - 40.146.0 %   MCV 88.2 80.0 - 100.0 fL   MCH 29.7 26.0 -  34.0 pg   MCHC 33.7 30.0 - 36.0 g/dL   RDW 41.7 40.8 - 14.4 %   Platelets 329 150 - 400 K/uL   nRBC 0.0 0.0 - 0.2 %   Neutrophils Relative % 95 %   Neutro Abs 15.7 (H) 1.7 - 7.7 K/uL   Lymphocytes Relative 3 %   Lymphs Abs 0.6 (L) 0.7 - 4.0 K/uL   Monocytes Relative 1 %   Monocytes Absolute 0.2 0.1 - 1.0 K/uL   Eosinophils Relative 0 %   Eosinophils Absolute 0.0 0.0 - 0.5 K/uL   Basophils Relative 0 %   Basophils Absolute 0.1 0.0 - 0.1 K/uL   Immature Granulocytes 1 %   Abs Immature Granulocytes 0.08 (H) 0.00 - 0.07 K/uL    Comment: Performed at [facility], [address], [place], [place] [address]  Magnesium     Status: None   Collection Time: 05/07/19  6:34 AM  Result Value Ref Range   Magnesium 2.1 1.7 - 2.4  mg/dL    Comment: Performed at [facility], [address] [place], [place] [address]  Urinalysis, Routine w reflex microscopic     Status: Abnormal   Collection Time: 05/07/19  6:57 AM  Result Value Ref Range   Color, Urine YELLOW YELLOW   APPearance CLEAR CLEAR   Specific Gravity, Urine >1.046 (H) 1.005 - 1.030   pH 5.0 5.0 - 8.0   Glucose, UA NEGATIVE NEGATIVE mg/dL   Hgb urine dipstick NEGATIVE NEGATIVE   Bilirubin Urine NEGATIVE NEGATIVE   Ketones, ur 20 (A) NEGATIVE mg/dL   Protein, ur 30 (A) NEGATIVE mg/dL   Nitrite NEGATIVE NEGATIVE   Leukocytes,Ua TRACE (A) NEGATIVE   RBC / HPF 0-5 0 - 5 RBC/hpf   WBC, UA 0-5 0 - 5 WBC/hpf   Bacteria, UA RARE (A) NONE SEEN   Squamous Epithelial / LPF 0-5 0 - 5   Mucus PRESENT     Comment: Performed at [facility], [address] [place], [place] [address]   CT ABDOMEN PELVIS W CONTRAST  Result Date: 05/07/2019 CLINICAL DATA:  Abdominal pain. History of Crohn's disease and abdominal surgery. EXAM: CT ABDOMEN AND PELVIS WITH CONTRAST TECHNIQUE: Multidetector CT imaging of the abdomen and pelvis was performed using the standard protocol following bolus administration of intravenous contrast. CONTRAST:  OMNIPAQUE IOHEXOL 300 MG/ML  SOLN COMPARISON:  August 12, 2012 FINDINGS: Lower chest: The lung bases are clear. The heart size is normal. Hepatobiliary: The liver is normal. Normal gallbladder.There is no biliary ductal dilation. Pancreas: Normal contours without ductal dilatation. No peripancreatic fluid collection. Spleen: No splenic laceration or hematoma. Adrenals/Urinary Tract: --Adrenal glands: No adrenal hemorrhage. --Right kidney/ureter: No hydronephrosis or perinephric hematoma. --Left kidney/ureter: No hydronephrosis or perinephric hematoma. --Urinary bladder: Unremarkable. Stomach/Bowel: --Stomach/Duodenum: No hiatal hernia or other gastric abnormality. Normal duodenal course and caliber. --Small bowel: There are multiple  dilated loops of small bowel measuring up to approximately 5.2 cm. There is a transition point in the midline abdomen at the level of the patient's neoterminal ileum. At this level, there is diffuse small bowel wall thickening with adjacent inflammation. --Colon: No focal abnormality. --Appendix: Surgically absent. Vascular/Lymphatic: Atherosclerotic calcification is present within the non-aneurysmal abdominal aorta, without hemodynamically significant stenosis. --No retroperitoneal lymphadenopathy. --No mesenteric lymphadenopathy. --No pelvic or inguinal lymphadenopathy. Reproductive: Unremarkable Other: No ascites or free air. The abdominal wall is normal. Musculoskeletal. No acute displaced fractures. IMPRESSION: Small-bowel obstruction with transition point at  the level of the patient's neoterminal ileum. At this level, there is diffuse small bowel wall thickening with adjacent inflammation, consistent with active Crohn's disease. Aortic Atherosclerosis (ICD10-I70.0). Electronically Signed   By: Constance Holster M.D.   On: 05/07/2019 01:23   DG Abd Portable 1 View  Result Date: 05/07/2019 CLINICAL DATA:  Initial evaluation for NG tube placement. EXAM: PORTABLE ABDOMEN - 1 VIEW COMPARISON:  Prior CT from earlier the same day. FINDINGS: Enteric tube in place, seen coiled within the stomach, tip overlying the gastric antrum. Side hole well beyond the GE junction. Secreted IV contrast material seen within the renal collecting systems bilaterally. IMPRESSION: Enteric tube in place, tip overlying the gastric antrum. Side hole well beyond the GE junction. Electronically Signed   By: Jeannine Boga M.D.   On: 05/07/2019 03:00   Anti-infectives (From admission, onward)   None        Assessment/Plan Hypokalemia - replaced Elevated glucose - check A1c  Active Crohns disease SBO - No role for acute surgical intervention. Agree with NG tube for decompression. SBO likely secondary to active Crohns  disease. Management of Crohns per GI, their consult is pending. Hopefully this will resolve with Crohns treatment. Will continue to follow.  ID - none VTE - SCDs, sq heparin FEN - IVF, NPO/NGT Foley - none Follow up - TBD  Wellington [place], [facility] 05/07/2019, 10:39 AM Please see Amion for pager number during day hours 7:00am-4:30pm

## 2019-05-07 NOTE — Progress Notes (Signed)
Received pt here in 6N from ED, alert orientedx4. Pt's skin intact with NG tube on R nare and vital signs stable. Oriented to room, bed controls and plan of care. Left lying comfortably in bed with call bell at reach, NG tube on low intermittent suction, will continue to monitor.

## 2019-05-07 NOTE — ED Provider Notes (Signed)
[facility] Provider Note   CSN: [id number] Arrival date & time: 05/06/19  2159     History Chief Complaint  Patient presents with  . Abdominal Pain    Hansel FeinsteinDeborah A Muckle is a 53 y.o. female.  53 year old female with a history of ADHD and Crohn's disease presents to the emergency department for evaluation of abdominal pain.  Abdominal pain has been constant since this morning, waxing and waning in severity.  She reports intermittent sharp cramps across her bilateral upper abdomen with associated nausea.  She has had at least 5 episodes of vomiting today, all of which have been nonbloody.  Reports 2 episodes of diarrhea.  Has been passing some flatus.  Took Zanaflex for pain without relief.  Denies use of any other medications.  Is not on chronic prednisone.  Had an episode of similar abdominal pain 1 week ago which resolved after 3 days.  Denies sick contacts, dysuria, hematuria, melena, hematochezia.    Is not actively followed for her Crohn's disease.  Her history with regard to this is somewhat difficult given patient's inability to recall her prior procedures.  She states that she had "2 feet of my intestine and colon removed".  Also reporting a subsequent abdominal surgery due to "similar pain" associated with scar tissue from her initial procedure.  Endorsing prior appendectomy as well.  The history is provided by the patient. No language interpreter was used.  Abdominal Pain      Past Medical History:  Diagnosis Date  . ADHD   . Crohn's disease (HCC)     There are no problems to display for this patient.   Past Surgical History:  Procedure Laterality Date  . ABDOMINAL SURGERY       OB History   No obstetric history on file.     No family history on file.  Social History   Tobacco Use  . Smoking status: Current Every Day Smoker    Packs/day: 0.50    Years: 33.00    Pack years: 16.50    Types: Cigarettes  . Smokeless  tobacco: Never Used  Substance Use Topics  . Alcohol use: Not Currently  . Drug use: Never    Home Medications Prior to Admission medications   Not on File    Allergies    Patient has no known allergies.  Review of Systems   Review of Systems  Gastrointestinal: Positive for abdominal pain.  Ten systems reviewed and are negative for acute change, except as noted in the HPI.    Physical Exam Updated Vital Signs BP 109/69   Pulse 88   Temp 98.6 F (37 C) (Oral)   Resp 18   Wt 69.9 kg   SpO2 93%   Physical Exam Vitals and nursing note reviewed.  Constitutional:      General: She is not in acute distress.    Appearance: She is well-developed. She is not diaphoretic.     Comments: Appears uncomfortable, but nontoxic  HENT:     Head: Normocephalic and atraumatic.  Eyes:     General: No scleral icterus.    Conjunctiva/sclera: Conjunctivae normal.  Cardiovascular:     Rate and Rhythm: Normal rate and regular rhythm.     Pulses: Normal pulses.  Pulmonary:     Effort: Pulmonary effort is normal. No respiratory distress.     Comments: Respirations even and unlabored Abdominal:     Comments: Hypoactive bowel sounds diffusely.  Abdomen soft with generalized tenderness  to palpation.  No peritoneal signs.  Musculoskeletal:        General: Normal range of motion.     Cervical back: Normal range of motion.  Skin:    General: Skin is warm and dry.     Coloration: Skin is not pale.     Findings: No erythema or rash.  Neurological:     General: No focal deficit present.     Mental Status: She is alert and oriented to person, place, and time.     Coordination: Coordination normal.     Comments: GCS 15.  Moving all extremities spontaneously.  Psychiatric:        Behavior: Behavior normal.     ED Results / Procedures / Treatments   Labs (all labs ordered are listed, but only abnormal results are displayed) Labs Reviewed  COMPREHENSIVE METABOLIC PANEL - Abnormal; Notable  for the following components:      Result Value   Potassium 2.8 (*)    Glucose, Bld 158 (*)    Calcium 8.4 (*)    Albumin 3.3 (*)    All other components within normal limits  CBC - Abnormal; Notable for the following components:   WBC 24.8 (*)    All other components within normal limits  SARS CORONAVIRUS 2 (TAT 6-24 HRS)  LIPASE, BLOOD  URINALYSIS, ROUTINE W REFLEX MICROSCOPIC  I-STAT BETA HCG BLOOD, ED ([facility], [facility], [facility] ONLY)    EKG None  Radiology CT ABDOMEN PELVIS W CONTRAST  Result Date: 05/07/2019 CLINICAL DATA:  Abdominal pain. History of Crohn's disease and abdominal surgery. EXAM: CT ABDOMEN AND PELVIS WITH CONTRAST TECHNIQUE: Multidetector CT imaging of the abdomen and pelvis was performed using the standard protocol following bolus administration of intravenous contrast. CONTRAST:  150mL OMNIPAQUE IOHEXOL 300 MG/ML  SOLN COMPARISON:  August 12, 2012 FINDINGS: Lower chest: The lung bases are clear. The heart size is normal. Hepatobiliary: The liver is normal. Normal gallbladder.There is no biliary ductal dilation. Pancreas: Normal contours without ductal dilatation. No peripancreatic fluid collection. Spleen: No splenic laceration or hematoma. Adrenals/Urinary Tract: --Adrenal glands: No adrenal hemorrhage. --Right kidney/ureter: No hydronephrosis or perinephric hematoma. --Left kidney/ureter: No hydronephrosis or perinephric hematoma. --Urinary bladder: Unremarkable. Stomach/Bowel: --Stomach/Duodenum: No hiatal hernia or other gastric abnormality. Normal duodenal course and caliber. --Small bowel: There are multiple dilated loops of small bowel measuring up to approximately 5.2 cm. There is a transition point in the midline abdomen at the level of the patient's neoterminal ileum. At this level, there is diffuse small bowel wall thickening with adjacent inflammation. --Colon: No focal abnormality. --Appendix: Surgically absent. Vascular/Lymphatic: Atherosclerotic calcification is present  within the non-aneurysmal abdominal aorta, without hemodynamically significant stenosis. --No retroperitoneal lymphadenopathy. --No mesenteric lymphadenopathy. --No pelvic or inguinal lymphadenopathy. Reproductive: Unremarkable Other: No ascites or free air. The abdominal wall is normal. Musculoskeletal. No acute displaced fractures. IMPRESSION: Small-bowel obstruction with transition point at the level of the patient's neoterminal ileum. At this level, there is diffuse small bowel wall thickening with adjacent inflammation, consistent with active Crohn's disease. Aortic Atherosclerosis (ICD10-I70.0). Electronically Signed   By: Constance Holster M.D.   On: 05/07/2019 01:23    Procedures Procedures (including critical care time)  Medications Ordered in ED Medications  lidocaine (XYLOCAINE) 2 % viscous mouth solution 15 mL (has no administration in time range)  sodium chloride flush (NS) 0.9 % injection 3 mL (3 mLs Intravenous Given 05/07/19 0052)  HYDROmorphone (DILAUDID) injection 1 mg (1 mg Intravenous Given 05/07/19 0049)  metoCLOPramide (  REGLAN) injection 10 mg (10 mg Intravenous Given 05/07/19 0049)  sodium chloride 0.9 % bolus 1,000 mL (1,000 mLs Intravenous New Bag/Given 05/07/19 0048)  famotidine (PEPCID) IVPB 20 mg premix (0 mg Intravenous Stopped 05/07/19 0125)  potassium chloride 10 mEq in 100 mL IVPB (0 mEq Intravenous Stopped 05/07/19 0217)  iohexol (OMNIPAQUE) 300 MG/ML solution 100 mL (100 mLs Intravenous Contrast Given 05/07/19 0057)  methylPREDNISolone sodium succinate (SOLU-MEDROL) 125 mg/2 mL injection 125 mg (125 mg Intravenous Given 05/07/19 0143)    ED Course  I have reviewed the triage vital signs and the nursing notes.  Pertinent labs & imaging results that were available during my care of the patient were reviewed by me and considered in my medical decision making (see chart for details).    MDM Rules/Calculators/A&P   53 year old female to be admitted for  management of small bowel obstruction with associated Crohn's flare.  NG tube ordered for placement.  Pain has been better controlled with Dilaudid.  She also received IV Solu-Medrol while in the ED.  Is not consistently followed by primary care or GI.  Reports last experiencing a Crohn's flare approximately 20 years ago.  Dr. Toniann Fail to evaluate in the ED for admission.   Final Clinical Impression(s) / ED Diagnoses Final diagnoses:  Small bowel obstruction [facility])    Rx / DC Orders ED Discharge Orders    None       Antony Madura, PA-C 05/07/19 0222    Melene Plan, DO 05/07/19 [phone]

## 2019-05-07 NOTE — Progress Notes (Signed)
CBG monitoring Q6 for pt per MD's order, Pt's blood sugar at 0609 is 157, no orders for insulin coverage, notified MD.

## 2019-05-07 NOTE — ED Notes (Signed)
This RN requested Pharmacy send dextrose with potassium

## 2019-05-07 NOTE — Consult Note (Signed)
Referring Provider:  Dr. Sharlette Dense Primary Care Physician:  Nonnie Done., MD Primary Gastroenterologist: None (apparently seen in the past by Dr. Evette Cristal)  Reason for Consultation: Crohn's disease with small bowel obstruction  HPI: Holly Stevens is a 53 y.o. female admitted to the hospital early this morning because of symptoms and radiographic findings of anastomotic bowel obstruction.  The patient indicates that she was diagnosed with Crohn's disease around 25 years ago and underwent a resection followed a year later by another operation for small bowel obstruction.  Details of those surgeries are not currently available.  She was apparently seen in the past by Dr. Evette Cristal and may have been on medical therapy for a period of time (she really does not recall very well), but she does know that she has not been on medication for many years and was basically lost to follow-up and pretty much asymptomatic, other than occasional abdominal symptoms after eating certain foods such as corn, until about 10 days ago when she developed severe, colicky pain in the right lower quadrant of her abdomen, which is the characteristic place for her to get those symptoms.  She had vomiting and nonbloody diarrhea, no fever.  The symptoms lasted a day or 2 but then resolved on their own and she did fairly well for the next several days until she started having recurrent, albeit less severe, symptoms 2 days ago, prompting her to come to the emergency room last night.  CT scan showed evidence of small bowel obstruction with a transition point at the ileocolonic anastomosis, as well as inflammatory changes of the intestine in that region.   Past Medical History:  Diagnosis Date  . ADHD   . Crohn's disease [facility])     Past Surgical History:  Procedure Laterality Date  . ABDOMINAL SURGERY      Prior to Admission medications   Not on File    Current Facility-Administered Medications  Medication Dose Route  Frequency Provider Last Rate Last Admin  . acetaminophen (TYLENOL) tablet 650 mg  650 mg Oral Q6H PRN Eduard Clos, MD       Or  . acetaminophen (TYLENOL) suppository 650 mg  650 mg Rectal Q6H PRN Eduard Clos, MD      . dextrose 5 % with KCl 20 mEq / L  infusion  20 mEq Intravenous Continuous Eduard Clos, MD 75 mL/hr at 05/07/19 0348 20 mEq at 05/07/19 0348  . heparin injection 5,000 Units  5,000 Units Subcutaneous Q8H Eduard Clos, MD      . HYDROmorphone (DILAUDID) injection 0.5 mg  0.5 mg Intravenous Q3H PRN Eduard Clos, MD   0.5 mg at 05/07/19 8182  . methylPREDNISolone sodium succinate (SOLU-MEDROL) 40 mg/mL injection 40 mg  40 mg Intravenous Daily Eduard Clos, MD   40 mg at 05/07/19 0943  . ondansetron (ZOFRAN) tablet 4 mg  4 mg Oral Q6H PRN Eduard Clos, MD       Or  . ondansetron [facility]) injection 4 mg  4 mg Intravenous Q6H PRN Eduard Clos, MD   4 mg at 05/07/19 0452    Allergies as of 05/06/2019  . (No Known Allergies)    Family History  Family history unknown: Yes    Social History   Socioeconomic History  . Marital status: Married    Spouse name: Not on file  . Number of children: Not on file  . Years of education: Not on file  .  Highest education level: Not on file  Occupational History  . Not on file  Tobacco Use  . Smoking status: Current Every Day Smoker    Packs/day: 0.50    Years: 33.00    Pack years: 16.50    Types: Cigarettes  . Smokeless tobacco: Never Used  Substance and Sexual Activity  . Alcohol use: Not Currently  . Drug use: Never  . Sexual activity: Not on file  Other Topics Concern  . Not on file  Social History Narrative  . Not on file   Social Determinants of Health   Financial Resource Strain:   . Difficulty of Paying Living Expenses: Not on file  Food Insecurity:   . Worried About Charity fundraiser in the Last Year: Not on file  . Ran Out of Food in the Last  Year: Not on file  Transportation Needs:   . Lack of Transportation (Medical): Not on file  . Lack of Transportation (Non-Medical): Not on file  Physical Activity:   . Days of Exercise per Week: Not on file  . Minutes of Exercise per Session: Not on file  Stress:   . Feeling of Stress : Not on file  Social Connections:   . Frequency of Communication with Friends and Family: Not on file  . Frequency of Social Gatherings with Friends and Family: Not on file  . Attends Religious Services: Not on file  . Active Member of Clubs or Organizations: Not on file  . Attends Archivist Meetings: Not on file  . Marital Status: Not on file  Intimate Partner Violence:   . Fear of Current or Ex-Partner: Not on file  . Emotionally Abused: Not on file  . Physically Abused: Not on file  . Sexually Abused: Not on file    Review of Systems: The patient does help to look after her 20 year old son who has been in a wheelchair following a traumatic brain injury from an ATV for the past 10 years or so.  She smokes less than 1/2 pack/day.  She reports 5-10 loose bowel movements per day, none nocturnal.  Her appetite is okay and her weight has been essentially stable.  No upper GI tract symptoms such as reflux or dysphagia.  No lymphadenopathy, skin rashes, mouth sores, or joint swelling.  Physical Exam: Vital signs in last 24 hours: Temp:  [97.9 F (36.6 C)-98.6 F (37 C)] 97.9 F (36.6 C) (12/14 0445) Pulse Rate:  [74-93] 77 (12/14 0445) Resp:  [11-19] 18 (12/14 0445) BP: (109-138)/(64-77) 119/70 (12/14 0445) SpO2:  [92 %-99 %] 97 % (12/14 0445) Weight:  [69.9 kg-71.9 kg] 71.9 kg (12/14 0454) Last BM Date: 05/06/19  Healthy appearing, mildly overweight Caucasian female who does not look chronically ill, NG tube in place draining a small to moderate amounts of gastric looking fluid.  She is anicteric and without overt pallor.  Chest clear, heart has a 2/6 to 3/6 systolic murmur.  Abdomen is  quiet, soft, without mass-effect or tenderness.  No skin rashes, no peripheral edema, no evident focal neurologic deficits.  Mood normal.  Intake/Output from previous day: 12/13 0701 - 12/14 0700 In: 407.7 [I.V.:197.5; IV Piggyback:210.2] Out: 250 [Urine:200; Emesis/NG output:50] Intake/Output this shift: No intake/output data recorded.  Lab Results: Recent Labs    05/06/19 2223 05/07/19 0305 05/07/19 0634  WBC 24.8* 18.4* 16.5*  HGB 13.1 12.1 12.1  HCT 39.9 36.1 35.9*  PLT 372 344 329   BMET Recent Labs    05/06/19  2223 05/07/19 0305 05/07/19 0634  NA 139  --  141  K 2.8*  --  3.4*  CL 104  --  105  CO2 22  --  24  GLUCOSE 158*  --  174*  BUN 13  --  16  CREATININE 0.73 0.63 0.79  CALCIUM 8.4*  --  8.0*   LFT Recent Labs    05/07/19 0634  PROT 6.2*  ALBUMIN 2.9*  AST 14*  ALT 12  ALKPHOS 69  BILITOT 0.3   PT/INR No results for input(s): LABPROT, INR in the last 72 hours.  Studies/Results: CT ABDOMEN PELVIS W CONTRAST  Result Date: 05/07/2019 CLINICAL DATA:  Abdominal pain. History of Crohn's disease and abdominal surgery. EXAM: CT ABDOMEN AND PELVIS WITH CONTRAST TECHNIQUE: Multidetector CT imaging of the abdomen and pelvis was performed using the standard protocol following bolus administration of intravenous contrast. CONTRAST:  100mL OMNIPAQUE IOHEXOL 300 MG/ML  SOLN COMPARISON:  August 12, 2012 FINDINGS: Lower chest: The lung bases are clear. The heart size is normal. Hepatobiliary: The liver is normal. Normal gallbladder.There is no biliary ductal dilation. Pancreas: Normal contours without ductal dilatation. No peripancreatic fluid collection. Spleen: No splenic laceration or hematoma. Adrenals/Urinary Tract: --Adrenal glands: No adrenal hemorrhage. --Right kidney/ureter: No hydronephrosis or perinephric hematoma. --Left kidney/ureter: No hydronephrosis or perinephric hematoma. --Urinary bladder: Unremarkable. Stomach/Bowel: --Stomach/Duodenum: No hiatal  hernia or other gastric abnormality. Normal duodenal course and caliber. --Small bowel: There are multiple dilated loops of small bowel measuring up to approximately 5.2 cm. There is a transition point in the midline abdomen at the level of the patient's neoterminal ileum. At this level, there is diffuse small bowel wall thickening with adjacent inflammation. --Colon: No focal abnormality. --Appendix: Surgically absent. Vascular/Lymphatic: Atherosclerotic calcification is present within the non-aneurysmal abdominal aorta, without hemodynamically significant stenosis. --No retroperitoneal lymphadenopathy. --No mesenteric lymphadenopathy. --No pelvic or inguinal lymphadenopathy. Reproductive: Unremarkable Other: No ascites or free air. The abdominal wall is normal. Musculoskeletal. No acute displaced fractures. IMPRESSION: Small-bowel obstruction with transition point at the level of the patient's neoterminal ileum. At this level, there is diffuse small bowel wall thickening with adjacent inflammation, consistent with active Crohn's disease. Aortic Atherosclerosis (ICD10-I70.0). Electronically Signed   By: Katherine Mantlehristopher  Green M.D.   On: 05/07/2019 01:23   DG Abd Portable 1 View  Result Date: 05/07/2019 CLINICAL DATA:  Initial evaluation for NG tube placement. EXAM: PORTABLE ABDOMEN - 1 VIEW COMPARISON:  Prior CT from earlier the same day. FINDINGS: Enteric tube in place, seen coiled within the stomach, tip overlying the gastric antrum. Side hole well beyond the GE junction. Secreted IV contrast material seen within the renal collecting systems bilaterally. IMPRESSION: Enteric tube in place, tip overlying the gastric antrum. Side hole well beyond the GE junction. Electronically Signed   By: Rise MuBenjamin  McClintock M.D.   On: 05/07/2019 03:00    Impression: Recurrent Crohn's with small bowel obstruction.  Difficult to say how much of her current symptoms are due to inflammation and edema, versus chronic  stricturing.  Plan: Agree with IV Solu-Medrol and continued NG suction with observation.  If she opens up, gradual advancement of diet with elective outpatient evaluation to include possible CT enterography and colonoscopy, to help guide medical decision making about surgery versus medical therapy.   LOS: 0 days   Katy FitchRobert V Taishawn Smaldone  05/07/2019, 12:30 PM   Pager [phone] If no answer or after 5 PM call [phone]

## 2019-05-07 NOTE — Progress Notes (Signed)
No charge FU note  Pt seen and examined, admitted this AM with pSBO now comfortable with NGtube in place, no BM or flatus. - continue present care - GI Dr. Therisa Doyne consulted - awaiting call back from surgery for inpatient consult

## 2019-05-07 NOTE — ED Notes (Signed)
ED TO INPATIENT HANDOFF REPORT  ED Nurse Name and Phone #: Dontasia Miranda, [phone]  S Name/Age/Gender Holly Stevens 53 y.o. female Room/Bed: 006C/006C  Code Status   Code Status: Full Code  Home/SNF/Other Home Patient oriented to: self, place, time and situation Is this baseline? Yes   Triage Complete: Triage complete  Chief Complaint SBO (small bowel obstruction) (HCC) [K56.609]  Triage Note Pt with hx of crohns here for abdominal pain which began this am.  No fever, she states that she has vomited x4 today, she has had some diarrhea today x2.  Pt states that she had abdominal pain last week and resolved on its own in 3 days.  Pt states that her last crohns flare up was around 20 years ago and pt is not on any meds for this.  Pt appears in no acute distress in triage.     Allergies No Known Allergies  Level of Care/Admitting Diagnosis ED Disposition    ED Disposition Condition Comment   Admit  Hospital Area: [facility] [[id number]]  Level of Care: Telemetry Medical [104]  Covid Evaluation: Asymptomatic Screening Protocol (No Symptoms)  Diagnosis: SBO (small bowel obstruction) [facility] [[id number]]  Admitting Physician: Eduard Clos [phone]  Attending Physician: Eduard Clos [phone]  Estimated length of stay: past midnight tomorrow  Certification:: I certify this patient will need inpatient services for at least 2 midnights       B Medical/Surgery History Past Medical History:  Diagnosis Date  . ADHD   . Crohn's disease [facility])    Past Surgical History:  Procedure Laterality Date  . ABDOMINAL SURGERY       A IV Location/Drains/Wounds Patient Lines/Drains/Airways Status   Active Line/Drains/Airways    Name:   Placement date:   Placement time:   Site:   Days:   Peripheral IV 05/07/19 Right Antecubital   05/07/19    0025    Antecubital   less than 1   NG/OG Tube Nasogastric 16 Fr. Right nare Xray   05/07/19    0251    Right nare   less than 1           Intake/Output Last 24 hours No intake or output data in the 24 hours ending 05/07/19 0415  Labs/Imaging Results for orders placed or performed during the hospital encounter of 05/06/19 (from the past 48 hour(s))  Lipase, blood     Status: None   Collection Time: 05/06/19 10:23 PM  Result Value Ref Range   Lipase 21 11 - 51 U/L    Comment: Performed at [facility], [address], [place], [place] [address]  Comprehensive metabolic panel     Status: Abnormal   Collection Time: 05/06/19 10:23 PM  Result Value Ref Range   Sodium 139 135 - 145 mmol/L   Potassium 2.8 (L) 3.5 - 5.1 mmol/L   Chloride 104 98 - 111 mmol/L   CO2 22 22 - 32 mmol/L   Glucose, Bld 158 (H) 70 - 99 mg/dL   BUN 13 6 - 20 mg/dL   Creatinine, Ser 5.10 0.44 - 1.00 mg/dL   Calcium 8.4 (L) 8.9 - 10.3 mg/dL   Total Protein 6.8 6.5 - 8.1 g/dL   Albumin 3.3 (L) 3.5 - 5.0 g/dL   AST 17 15 - 41 U/L   ALT 12 0 - 44 U/L   Alkaline Phosphatase 70 38 - 126 U/L   Total Bilirubin 1.0 0.3 - 1.2 mg/dL   GFR  calc non Af Amer >60 >60 mL/min   GFR calc Af Amer >60 >60 mL/min   Anion gap 13 5 - 15    Comment: Performed at [facility], [address] [place], [place] [address]  CBC     Status: Abnormal   Collection Time: 05/06/19 10:23 PM  Result Value Ref Range   WBC 24.8 (H) 4.0 - 10.5 K/uL   RBC 4.39 3.87 - 5.11 MIL/uL   Hemoglobin 13.1 12.0 - 15.0 g/dL   HCT 60.439.9 54.036.0 - 98.146.0 %   MCV 90.9 80.0 - 100.0 fL   MCH 29.8 26.0 - 34.0 pg   MCHC 32.8 30.0 - 36.0 g/dL   RDW 19.113.2 47.811.5 - 29.515.5 %   Platelets 372 150 - 400 K/uL   nRBC 0.0 0.0 - 0.2 %    Comment: Performed at [facility], [address] [place], [place] [address]  I-Stat beta hCG blood, ED     Status: None   Collection Time: 05/07/19 12:42 AM  Result Value Ref Range   I-stat hCG, quantitative <5.0 <5 mIU/mL   Comment 3            Comment:   GEST. AGE      CONC.  (mIU/mL)   <=1 WEEK        5 - 50     2 WEEKS       50 - 500     3 WEEKS        100 - 10,000     4 WEEKS     1,000 - 30,000        FEMALE AND NON-PREGNANT FEMALE:     LESS THAN 5 mIU/mL    CT ABDOMEN PELVIS W CONTRAST  Result Date: 05/07/2019 CLINICAL DATA:  Abdominal pain. History of Crohn's disease and abdominal surgery. EXAM: CT ABDOMEN AND PELVIS WITH CONTRAST TECHNIQUE: Multidetector CT imaging of the abdomen and pelvis was performed using the standard protocol following bolus administration of intravenous contrast. CONTRAST:  100mL OMNIPAQUE IOHEXOL 300 MG/ML  SOLN COMPARISON:  August 12, 2012 FINDINGS: Lower chest: The lung bases are clear. The heart size is normal. Hepatobiliary: The liver is normal. Normal gallbladder.There is no biliary ductal dilation. Pancreas: Normal contours without ductal dilatation. No peripancreatic fluid collection. Spleen: No splenic laceration or hematoma. Adrenals/Urinary Tract: --Adrenal glands: No adrenal hemorrhage. --Right kidney/ureter: No hydronephrosis or perinephric hematoma. --Left kidney/ureter: No hydronephrosis or perinephric hematoma. --Urinary bladder: Unremarkable. Stomach/Bowel: --Stomach/Duodenum: No hiatal hernia or other gastric abnormality. Normal duodenal course and caliber. --Small bowel: There are multiple dilated loops of small bowel measuring up to approximately 5.2 cm. There is a transition point in the midline abdomen at the level of the patient's neoterminal ileum. At this level, there is diffuse small bowel wall thickening with adjacent inflammation. --Colon: No focal abnormality. --Appendix: Surgically absent. Vascular/Lymphatic: Atherosclerotic calcification is present within the non-aneurysmal abdominal aorta, without hemodynamically significant stenosis. --No retroperitoneal lymphadenopathy. --No mesenteric lymphadenopathy. --No pelvic or inguinal lymphadenopathy. Reproductive: Unremarkable Other: No ascites or free air. The abdominal wall is normal. Musculoskeletal. No acute displaced fractures. IMPRESSION:  Small-bowel obstruction with transition point at the level of the patient's neoterminal ileum. At this level, there is diffuse small bowel wall thickening with adjacent inflammation, consistent with active Crohn's disease. Aortic Atherosclerosis (ICD10-I70.0). Electronically Signed   By: Katherine Mantlehristopher  Green M.D.   On: 05/07/2019 01:23   DG Abd Portable 1 View  Result Date: 05/07/2019 CLINICAL DATA:  Initial evaluation for NG tube placement. EXAM: PORTABLE ABDOMEN - 1 VIEW COMPARISON:  Prior CT from earlier the same day. FINDINGS: Enteric tube in place, seen coiled within the stomach, tip overlying the gastric antrum. Side hole well beyond the GE junction. Secreted IV contrast material seen within the renal collecting systems bilaterally. IMPRESSION: Enteric tube in place, tip overlying the gastric antrum. Side hole well beyond the GE junction. Electronically Signed   By: Jeannine Boga M.D.   On: 05/07/2019 03:00    Pending Labs Unresulted Labs (From admission, onward)    Start     Ordered   05/07/19 0430  HIV Antibody (routine testing w rflx)  Once,   R     05/07/19 0430   05/07/19 0305  CBC  (heparin)  Once,   STAT    Comments: Baseline for heparin therapy IF NOT ALREADY DRAWN.  Notify MD if PLT < 100 K.    05/07/19 0307   05/07/19 0305  Creatinine, serum  (heparin)  Once,   STAT    Comments: Baseline for heparin therapy IF NOT ALREADY DRAWN.    05/07/19 0307   05/07/19 0139  SARS CORONAVIRUS 2 (TAT 6-24 HRS) Nasopharyngeal Nasopharyngeal Swab  (Tier 3 (TAT 6-24 hrs))  Once,   STAT    Question Answer Comment  Is this test for diagnosis or screening Screening   Symptomatic for COVID-19 as defined by CDC No   Hospitalized for COVID-19 No   Admitted to ICU for COVID-19 No   Previously tested for COVID-19 No   Resident in a congregate (group) care setting No   Employed in healthcare setting No   Pregnant No      05/07/19 0138   05/06/19 2222  Urinalysis, Routine w reflex  microscopic  ONCE - STAT,   STAT     05/06/19 2221          Vitals/Pain Today's Vitals   05/07/19 0200 05/07/19 0215 05/07/19 0230 05/07/19 0245  BP: 109/68 114/67 116/64 125/69  Pulse: 84 83 84 85  Resp: 19 15 16 18   Temp:      TempSrc:      SpO2: 94% 94% 97% 96%  Weight:      PainSc:        Isolation Precautions No active isolations  Medications Medications  acetaminophen (TYLENOL) tablet 650 mg (has no administration in time range)    Or  acetaminophen (TYLENOL) suppository 650 mg (has no administration in time range)  ondansetron (ZOFRAN) tablet 4 mg (has no administration in time range)    Or  ondansetron (ZOFRAN) injection 4 mg (has no administration in time range)  heparin injection 5,000 Units (has no administration in time range)  dextrose 5 % with KCl 20 mEq / L  infusion (20 mEq Intravenous New Bag/Given 05/07/19 0348)  HYDROmorphone (DILAUDID) injection 0.5 mg (has no administration in time range)  methylPREDNISolone sodium succinate (SOLU-MEDROL) 40 mg/mL injection 40 mg (has no administration in time range)  sodium chloride flush (NS) 0.9 % injection 3 mL (3 mLs Intravenous Given 05/07/19 0052)  HYDROmorphone (DILAUDID) injection 1 mg (1 mg Intravenous Given 05/07/19 0049)  metoCLOPramide (REGLAN) injection 10 mg (10 mg Intravenous Given 05/07/19 0049)  sodium chloride 0.9 % bolus 1,000 mL (0 mLs Intravenous Stopped 05/07/19 0414)  famotidine (PEPCID) IVPB 20 mg premix (0 mg Intravenous Stopped 05/07/19 0125)  potassium chloride 10 mEq in 100 mL IVPB (0 mEq Intravenous Stopped 05/07/19 0321)  iohexol (OMNIPAQUE) 300 MG/ML  solution 100 mL (100 mLs Intravenous Contrast Given 05/07/19 0057)  methylPREDNISolone sodium succinate (SOLU-MEDROL) 125 mg/2 mL injection 125 mg (125 mg Intravenous Given 05/07/19 0143)  lidocaine (XYLOCAINE) 2 % viscous mouth solution 15 mL (15 mLs Mouth/Throat Given 05/07/19 0234)  HYDROmorphone (DILAUDID) injection 1 mg (1 mg  Intravenous Given 05/07/19 0305)    Mobility walks Low fall risk   Focused Assessments     R Recommendations: See Admitting Provider Note  Report given to:   Additional Notes:

## 2019-05-08 ENCOUNTER — Encounter (HOSPITAL_COMMUNITY): Payer: Self-pay | Admitting: Internal Medicine

## 2019-05-08 LAB — BASIC METABOLIC PANEL
Anion gap: 9 (ref 5–15)
BUN: 12 mg/dL (ref 6–20)
CO2: 34 mmol/L — ABNORMAL HIGH (ref 22–32)
Calcium: 8.3 mg/dL — ABNORMAL LOW (ref 8.9–10.3)
Chloride: 97 mmol/L — ABNORMAL LOW (ref 98–111)
Creatinine, Ser: 0.71 mg/dL (ref 0.44–1.00)
GFR calc Af Amer: >60 mL/min (ref >60)
GFR calc non Af Amer: >60 mL/min (ref >60)
Glucose, Bld: 111 mg/dL — ABNORMAL HIGH (ref 70–99)
Potassium: 3 mmol/L — ABNORMAL LOW (ref 3.5–5.1)
Sodium: 140 mmol/L (ref 135–145)

## 2019-05-08 LAB — CBC
HCT: 34 % — ABNORMAL LOW (ref 36.0–46.0)
Hemoglobin: 11.2 g/dL — ABNORMAL LOW (ref 12.0–15.0)
MCH: 29.7 pg (ref 26.0–34.0)
MCHC: 32.9 g/dL (ref 30.0–36.0)
MCV: 90.2 fL (ref 80.0–100.0)
Platelets: 356 10*3/uL (ref 150–400)
RBC: 3.77 MIL/uL — ABNORMAL LOW (ref 3.87–5.11)
RDW: 13.1 % (ref 11.5–15.5)
WBC: 14.1 10*3/uL — ABNORMAL HIGH (ref 4.0–10.5)
nRBC: 0 % (ref 0.0–0.2)

## 2019-05-08 LAB — GLUCOSE, CAPILLARY
Glucose-Capillary: 112 mg/dL — ABNORMAL HIGH (ref 70–99)
Glucose-Capillary: 123 mg/dL — ABNORMAL HIGH (ref 70–99)
Glucose-Capillary: 124 mg/dL — ABNORMAL HIGH (ref 70–99)
Glucose-Capillary: 150 mg/dL — ABNORMAL HIGH (ref 70–99)

## 2019-05-08 LAB — MAGNESIUM: Magnesium: 2.2 mg/dL (ref 1.7–2.4)

## 2019-05-08 MED ORDER — POTASSIUM CHLORIDE CRYS ER 10 MEQ PO TBCR
10.0000 meq | EXTENDED_RELEASE_TABLET | Freq: Two times a day (BID) | ORAL | Status: DC
  Administered 2019-05-08: 10 meq via ORAL
  Filled 2019-05-08: qty 1

## 2019-05-08 MED ORDER — KCL IN DEXTROSE-NACL 40-5-0.9 MEQ/L-%-% IV SOLN: INTRAVENOUS | Status: DC

## 2019-05-08 MED ORDER — POTASSIUM CL IN DEXTROSE 5% 20 MEQ/L IV SOLN
20.0000 meq | INTRAVENOUS | Status: AC
  Administered 2019-05-08 – 2019-05-09 (×2): 20 meq via INTRAVENOUS
  Filled 2019-05-08 (×3): qty 1000

## 2019-05-08 NOTE — Progress Notes (Signed)
    CC: Abdominal pain  Subjective: Her abdomen is feeling much better this a.m.  Her biggest complaint is the NG tube.  Abdomen soft, no pain.  She is having bowel movements.  Objective: Vital signs in last 24 hours: Temp:  [97.7 F (36.5 C)-98.6 F (37 C)] 97.7 F (36.5 C) (12/15 0419) Pulse Rate:  [71-73] 73 (12/15 0419) Resp:  [15-18] 18 (12/15 0419) BP: (96-127)/(54-69) 127/69 (12/15 0419) SpO2:  [94 %-95 %] 95 % (12/15 0419) Weight:  [72.8 kg] 72.8 kg (12/15 0552) Last BM Date: 05/06/19 N.p.o. 6 1616 IV Voided x2 1200 NG BM x4 Afebrile vital signs are stable No labs this a.m. No films this a.m. Intake/Output from previous day: 12/14 0701 - 12/15 0700 In: 1616.3 [I.V.:1616.3] Out: 1202 [Urine:2; Emesis/NG output:1200] Intake/Output this shift: No intake/output data recorded.  General appearance: alert, cooperative and no distress Resp: clear to auscultation bilaterally GI: soft, non-tender; bowel sounds normal; no masses,  no organomegaly  Lab Results:  Recent Labs    05/07/19 0305 05/07/19 0634  WBC 18.4* 16.5*  HGB 12.1 12.1  HCT 36.1 35.9*  PLT 344 329    BMET Recent Labs    05/06/19 2223 05/07/19 0305 05/07/19 0634  NA 139  --  141  K 2.8*  --  3.4*  CL 104  --  105  CO2 22  --  24  GLUCOSE 158*  --  174*  BUN 13  --  16  CREATININE 0.73 0.63 0.79  CALCIUM 8.4*  --  8.0*   PT/INR No results for input(s): LABPROT, INR in the last 72 hours.  Recent Labs  Lab 05/06/19 2223 05/07/19 0634  AST 17 14*  ALT 12 12  ALKPHOS 70 69  BILITOT 1.0 0.3  PROT 6.8 6.2*  ALBUMIN 3.3* 2.9*     Lipase     Component Value Date/Time   LIPASE 21 05/06/2019 2223     Medications: . heparin injection (subcutaneous)  5,000 Units Subcutaneous Q8H  . methylPREDNISolone (SOLU-MEDROL) injection  40 mg Intravenous Daily   . dextrose 5 % with KCl 20 mEq / L      Assessment/Plan Crohn's disease  -GI has added Solu-Medrol x2 doses so far Hx  ADHD  Small bowel obstruction  -Hx of oratory laparotomy ileocecectomy ~ 1996  - Hx SBR ~ 1997  FEN:  NPO/NG/IV fluids@75  ID:  None DVT:  SCD/heparin Follow up:  TBD  Plan: Start her on sips of clears, and pull her NG tube.  Mobilize.   LOS: 1 day    Holly Stevens 05/08/2019 Please see Amion

## 2019-05-08 NOTE — Progress Notes (Signed)
PROGRESS NOTE  Holly Stevens JAS:[id number] DOB: 05-19-1966 DOA: 05/06/2019 PCP: No primary care provider on file.  Hospital Course/Subjective: Holly Stevens is a 53 y.o. female with history of Crohn's disease status post resection about 25 years ago exact details of her surgery is not clear we do not have any access to her records likely in [facility] presents to the ER because of abdominal pain due to SBO. Seen by surgery and GI, she has been NPO with IVF and today she will have NG clamped and try sips of clears as she has had BMs. On steroids per GI.   Assessment/Plan: Principal Problem:   SBO (small bowel obstruction) (HCC) Active Problems:   Hypokalemia   1. Small bowel obstruction with history of Crohn's disease likely cause for the obstruction.  Patient received IV Solu-Medrol in the ER and I have placed patient on daily dose of Solu-Medrol IV which GI agrees with.  Cont NG tube per surgery will be clamped this AM.   2. Hypokalemia likely from vomiting replaced recheck in AM.  3. Leukocytosis likely reactionary.  Follow closely.  Patient afebrile. 4. Crohns - GI following, plan outpatient enterography vs colonoscopy  COVID-19 test is negative.  Given that patient is having small bowel obstruction will need more than 2 midnight stay and inpatient status.   DVT prophylaxis: SCDs and heparin. Code Status: Full code. Family Communication: Discussed with patient. Disposition Plan: Home. Consults called: None. Admission status: Inpatient. Consultants:  GI  surgery   Procedures:  NG tube 12/14   Antimicrobials:  None    Objective: Vitals:   05/07/19 1313 05/07/19 2041 05/08/19 0419 05/08/19 0552  BP: 96/60 (!) 108/54 127/69   Pulse: 73 71 73   Resp: 18 15 18    Temp: 98.1 F (36.7 C) 98.6 F (37 C) 97.7 F (36.5 C)   TempSrc: Oral Oral Oral   SpO2: 94% 94% 95%   Weight:    72.8 kg    Intake/Output Summary (Last 24 hours) at 05/08/2019 0948 Last  data filed at 05/08/2019 0400 Gross per 24 hour  Intake 1616.25 ml  Output 1201 ml  Net 415.25 ml   Filed Weights   05/06/19 2219 05/07/19 0454 05/08/19 0552  Weight: 69.9 kg 71.9 kg 72.8 kg     Exam: General:  Alert, oriented, calm, in no acute distress, NG in place Eyes: EOMI, clear sclerea Neck: supple, no masses, trachea mildline  Cardiovascular: RRR, no murmurs or rubs, no peripheral edema  Respiratory: clear to auscultation bilaterally, no wheezes, no crackles  Abdomen: soft, nontender, nondistended, normal bowel tones heard Skin: dry, no rashes  Musculoskeletal: no joint effusions, normal range of motion  Psychiatric: appropriate affect, normal speech  Neurologic: extraocular muscles intact, clear speech, moving all extremities with intact sensorium    Data Reviewed: CBC: Recent Labs  Lab 05/06/19 2223 05/07/19 0305 05/07/19 0634  WBC 24.8* 18.4* 16.5*  NEUTROABS  --   --  15.7*  HGB 13.1 12.1 12.1  HCT 39.9 36.1 35.9*  MCV 90.9 90.3 88.2  PLT 372 344 329   Basic Metabolic Panel: Recent Labs  Lab 05/06/19 2223 05/07/19 0305 05/07/19 0634  NA 139  --  141  K 2.8*  --  3.4*  CL 104  --  105  CO2 22  --  24  GLUCOSE 158*  --  174*  BUN 13  --  16  CREATININE 0.73 0.63 0.79  CALCIUM 8.4*  --  8.0*  MG  --   --  2.1   GFR: CrCl cannot be calculated (Unknown ideal weight.). Liver Function Tests: Recent Labs  Lab 05/06/19 2223 05/07/19 0634  AST 17 14*  ALT 12 12  ALKPHOS 70 69  BILITOT 1.0 0.3  PROT 6.8 6.2*  ALBUMIN 3.3* 2.9*   Recent Labs  Lab 05/06/19 2223  LIPASE 21   No results for input(s): AMMONIA in the last 168 hours. Coagulation Profile: No results for input(s): INR, PROTIME in the last 168 hours. Cardiac Enzymes: No results for input(s): CKTOTAL, CKMB, CKMBINDEX, TROPONINI in the last 168 hours. BNP (last 3 results) No results for input(s): PROBNP in the last 8760 hours. HbA1C: Recent Labs    05/07/19 0634  HGBA1C 6.3*    CBG: Recent Labs  Lab 05/07/19 0609 05/07/19 1216 05/07/19 1830 05/08/19 0028 05/08/19 0556  GLUCAP 157* 137* 131* 124* 112*   Lipid Profile: No results for input(s): CHOL, HDL, LDLCALC, TRIG, CHOLHDL, LDLDIRECT in the last 72 hours. Thyroid Function Tests: No results for input(s): TSH, T4TOTAL, FREET4, T3FREE, THYROIDAB in the last 72 hours. Anemia Panel: No results for input(s): VITAMINB12, FOLATE, FERRITIN, TIBC, IRON, RETICCTPCT in the last 72 hours. Urine analysis:    Component Value Date/Time   COLORURINE YELLOW 05/07/2019 [place] 05/07/2019 0657   LABSPEC >1.046 (H) 05/07/2019 0657   PHURINE 5.0 05/07/2019 [place] 05/07/2019 0657   HGBUR NEGATIVE 05/07/2019 0657   BILIRUBINUR NEGATIVE 05/07/2019 0657   KETONESUR 20 (A) 05/07/2019 0657   PROTEINUR 30 (A) 05/07/2019 0657   NITRITE NEGATIVE 05/07/2019 0657   LEUKOCYTESUR TRACE (A) 05/07/2019 0657   Sepsis Labs: @LABRCNTIP (procalcitonin:4,lacticidven:4)  ) Recent Results (from the past 240 hour(s))  SARS CORONAVIRUS 2 (TAT 6-24 HRS) Nasopharyngeal Nasopharyngeal Swab     Status: None   Collection Time: 05/07/19  2:01 AM   Specimen: Nasopharyngeal Swab  Result Value Ref Range Status   SARS Coronavirus 2 NEGATIVE NEGATIVE Final    Comment: (NOTE) SARS-CoV-2 target nucleic acids are NOT DETECTED. The SARS-CoV-2 RNA is generally detectable in upper and lower respiratory specimens during the acute phase of infection. Negative results do not preclude SARS-CoV-2 infection, do not rule out co-infections with other pathogens, and should not be used as the sole basis for treatment or other patient management decisions. Negative results must be combined with clinical observations, patient history, and epidemiological information. The expected result is Negative. Fact Sheet for Patients: SugarRoll.be Fact Sheet for Healthcare Providers:  https://www.woods-mathews.com/ This test is not yet approved or cleared by the [place] FDA and  has been authorized for detection and/or diagnosis of SARS-CoV-2 by FDA under an Emergency Use Authorization (EUA). This EUA will remain  in effect (meaning this test can be used) for the duration of the COVID-19 declaration under Section 56 4(b)(1) of the Act, 21 U.S.C. section 360bbb-3(b)(1), unless the authorization is terminated or revoked sooner. Performed at [facility], [place] [address], [place], [place] [address]      Studies: No results found.  Scheduled Meds: . heparin injection (subcutaneous)  5,000 Units Subcutaneous Q8H  . methylPREDNISolone (SOLU-MEDROL) injection  40 mg Intravenous Daily    Continuous Infusions: . dextrose 5 % with KCl 20 mEq / L       LOS: 1 day   Time spent: 23 minutes   Marry Guan, MD [facility] Pager [phone]  If 7PM-7AM, please contact night-coverage [email or web address] Password [facility] 05/08/2019, 9:48 AM

## 2019-05-08 NOTE — Progress Notes (Signed)
Patient doing significantly better than at time of admission, although she still thinks her stomach feels "iffy".  NG tube is currently clamped.    On exam, somewhat gurgling but not frankly obstructive bowel sounds are present, no distention, no tenderness.  Patient is in no distress, but is complaining significantly of throat soreness and gagging when she tries to swallow sips of liquids.  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    Plan: Observe overnight with tube clamped; I have ordered plain abdominal films for tomorrow.  Cleotis Nipper, M.D. Pager [phone] If no answer or after 5 PM call [phone]

## 2019-05-09 ENCOUNTER — Inpatient Hospital Stay (HOSPITAL_COMMUNITY): Payer: BLUE CROSS/BLUE SHIELD

## 2019-05-09 LAB — BASIC METABOLIC PANEL
Anion gap: 10 (ref 5–15)
BUN: 8 mg/dL (ref 6–20)
CO2: 30 mmol/L (ref 22–32)
Calcium: 8.5 mg/dL — ABNORMAL LOW (ref 8.9–10.3)
Chloride: 102 mmol/L (ref 98–111)
Creatinine, Ser: 0.75 mg/dL (ref 0.44–1.00)
GFR calc Af Amer: >60 mL/min (ref >60)
GFR calc non Af Amer: >60 mL/min (ref >60)
Glucose, Bld: 110 mg/dL — ABNORMAL HIGH (ref 70–99)
Potassium: 3.6 mmol/L (ref 3.5–5.1)
Sodium: 142 mmol/L (ref 135–145)

## 2019-05-09 LAB — GLUCOSE, CAPILLARY
Glucose-Capillary: 103 mg/dL — ABNORMAL HIGH (ref 70–99)
Glucose-Capillary: 148 mg/dL — ABNORMAL HIGH (ref 70–99)
Glucose-Capillary: 91 mg/dL (ref 70–99)
Glucose-Capillary: 99 mg/dL (ref 70–99)

## 2019-05-09 MED ORDER — PANTOPRAZOLE SODIUM 40 MG PO TBEC
40.0000 mg | DELAYED_RELEASE_TABLET | Freq: Every day | ORAL | Status: DC
  Administered 2019-05-10: 40 mg via ORAL
  Filled 2019-05-09: qty 1

## 2019-05-09 MED ORDER — PANTOPRAZOLE SODIUM 40 MG IV SOLR
40.0000 mg | INTRAVENOUS | Status: DC
  Administered 2019-05-09: 13:00:00 40 mg via INTRAVENOUS
  Filled 2019-05-09: qty 40

## 2019-05-09 MED ORDER — PREDNISONE 20 MG PO TABS
40.0000 mg | ORAL_TABLET | Freq: Every day | ORAL | Status: DC
  Administered 2019-05-10: 40 mg via ORAL
  Filled 2019-05-09: qty 2

## 2019-05-09 MED ORDER — POTASSIUM CL IN DEXTROSE 5% 20 MEQ/L IV SOLN
20.0000 meq | INTRAVENOUS | Status: DC
  Administered 2019-05-09: 20 meq via INTRAVENOUS
  Filled 2019-05-09: qty 1000

## 2019-05-09 NOTE — Progress Notes (Signed)
PROGRESS NOTE  Holly Stevens XBM:[id number] DOB: 1965-11-29 DOA: 05/06/2019 PCP: No primary care provider on file.  HPI/Recap of past 24 hours: Holly Stevens a 53 y.o.femalewithhistory of Crohn's disease status post resection about 25 years ago exact details of her surgery is not clear we do not have any access to her records likely in [facility] presents to the ER because of abdominal pain due to SBO. Seen by surgery and GI, she has been NPO with IVF and K+ replacement.  Had NG clamped and was started on ice chips  Which she has tolerated well.  She has had a BM, last one on 05/08/19.  On steroids per GI.   05/09/19: Seen and examined.  She has been walking around the unit as recommended to improve her SBO.  Positive flatus.  Last BM was yesterday.  Tolerating ice chips.  Denies any abdominal discomfort at this time.  NG tube clamped.  Abdominal x-ray done and pending.  Assessment/Plan: Principal Problem:   SBO (small bowel obstruction) (HCC) Active Problems:   Hypokalemia   SBO in the setting of Crohn's disease status post resection about 25 years ago Seen by general surgery and GI. Improving with positive flatus and BM Tolerating ice chips.  Clamped NG tube. Goal potassium 4.0 Abdominal x-ray done and pending Continue IV fluid with potassium supplement x1 day Continue daily BMP to monitor electrolytes Currently n.p.o., defer to general surgery to start clear liquid diet  Crohn's disease On IV Solu-Medrol per GI Start IV Protonix once daily for GI prophylaxis in the setting of daily steroid use GI following Will need to follow-up with GI outpatient  Leukocytosis, likely reactive in the setting of SBO and steroid use WBC trending down She has been afebrile and vital signs are stable She does not appear septic Repeat CBC in the morning  Resolved hypokalemia post repletion Continue to monitor with daily BMPs Last magnesium level 2.2 on 05/08/2019.   DVT  prophylaxis:Subcu heparin 3 times daily Code Status:Full code. Family Communication:None at bedside. Disposition Plan:Possible discharge to home in the next 24 to 48 hours once general surgery and GI signed off and once she is able to tolerate a soft diet. Consults called:GI and general surgery.  Consultants:  GI  surgery   Procedures:  NG tube 12/14   Antimicrobials:  None     Objective: Vitals:   05/08/19 0552 05/08/19 1444 05/08/19 2158 05/09/19 0526  BP:  121/69 126/77 128/80  Pulse:  72 67 72  Resp:  18 17 15   Temp:  98 F (36.7 C) 98.3 F (36.8 C) 98.3 F (36.8 C)  TempSrc:  Oral Oral   SpO2:  98% 97% 96%  Weight: 72.8 kg       Intake/Output Summary (Last 24 hours) at 05/09/2019 0754 Last data filed at 05/09/2019 0700 Gross per 24 hour  Intake 1496.42 ml  Output --  Net 1496.42 ml   Filed Weights   05/06/19 2219 05/07/19 0454 05/08/19 0552  Weight: 69.9 kg 71.9 kg 72.8 kg    Exam:  . General: 53 y.o. year-old female pleasant well developed well nourished in no acute distress.  Alert and oriented x3. . Cardiovascular: Regular rate and rhythm with no rubs or gallops.  No thyromegaly or JVD noted.   Marland Kitchen Respiratory: Clear to auscultation with no wheezes or rales. Good inspiratory effort. . Abdomen: Soft nontender nondistended hypoactive bowel sounds present. . Musculoskeletal: No lower extremity edema. 2/4 pulses in all 4  extremities. Marland Kitchen Psychiatry: Mood is appropriate for condition and setting   Data Reviewed: CBC: Recent Labs  Lab 05/06/19 2223 05/07/19 0305 05/07/19 0634 05/08/19 1059  WBC 24.8* 18.4* 16.5* 14.1*  NEUTROABS  --   --  15.7*  --   HGB 13.1 12.1 12.1 11.2*  HCT 39.9 36.1 35.9* 34.0*  MCV 90.9 90.3 88.2 90.2  PLT 372 344 329 356   Basic Metabolic Panel: Recent Labs  Lab 05/06/19 2223 05/07/19 0305 05/07/19 0634 05/08/19 1059 05/09/19 0245  NA 139  --  141 140 142  K 2.8*  --  3.4* 3.0* 3.6  CL 104  --  105 97*  102  CO2 22  --  24 34* 30  GLUCOSE 158*  --  174* 111* 110*  BUN 13  --  16 12 8   CREATININE 0.73 0.63 0.79 0.71 0.75  CALCIUM 8.4*  --  8.0* 8.3* 8.5*  MG  --   --  2.1 2.2  --    GFR: CrCl cannot be calculated (Unknown ideal weight.). Liver Function Tests: Recent Labs  Lab 05/06/19 2223 05/07/19 0634  AST 17 14*  ALT 12 12  ALKPHOS 70 69  BILITOT 1.0 0.3  PROT 6.8 6.2*  ALBUMIN 3.3* 2.9*   Recent Labs  Lab 05/06/19 2223  LIPASE 21   No results for input(s): AMMONIA in the last 168 hours. Coagulation Profile: No results for input(s): INR, PROTIME in the last 168 hours. Cardiac Enzymes: No results for input(s): CKTOTAL, CKMB, CKMBINDEX, TROPONINI in the last 168 hours. BNP (last 3 results) No results for input(s): PROBNP in the last 8760 hours. HbA1C: Recent Labs    05/07/19 0634  HGBA1C 6.3*   CBG: Recent Labs  Lab 05/08/19 0556 05/08/19 1157 05/08/19 1800 05/09/19 0013 05/09/19 0532  GLUCAP 112* 123* 150* 99 91   Lipid Profile: No results for input(s): CHOL, HDL, LDLCALC, TRIG, CHOLHDL, LDLDIRECT in the last 72 hours. Thyroid Function Tests: No results for input(s): TSH, T4TOTAL, FREET4, T3FREE, THYROIDAB in the last 72 hours. Anemia Panel: No results for input(s): VITAMINB12, FOLATE, FERRITIN, TIBC, IRON, RETICCTPCT in the last 72 hours. Urine analysis:    Component Value Date/Time   COLORURINE YELLOW 05/07/2019 0657   APPEARANCEUR CLEAR 05/07/2019 0657   LABSPEC >1.046 (H) 05/07/2019 0657   PHURINE 5.0 05/07/2019 0657   GLUCOSEU NEGATIVE 05/07/2019 0657   HGBUR NEGATIVE 05/07/2019 0657   BILIRUBINUR NEGATIVE 05/07/2019 0657   KETONESUR 20 (A) 05/07/2019 0657   PROTEINUR 30 (A) 05/07/2019 0657   NITRITE NEGATIVE 05/07/2019 0657   LEUKOCYTESUR TRACE (A) 05/07/2019 0657   Sepsis Labs: @LABRCNTIP (procalcitonin:4,lacticidven:4)  ) Recent Results (from the past 240 hour(s))  SARS CORONAVIRUS 2 (TAT 6-24 HRS) Nasopharyngeal Nasopharyngeal Swab      Status: None   Collection Time: 05/07/19  2:01 AM   Specimen: Nasopharyngeal Swab  Result Value Ref Range Status   SARS Coronavirus 2 NEGATIVE NEGATIVE Final    Comment: (NOTE) SARS-CoV-2 target nucleic acids are NOT DETECTED. The SARS-CoV-2 RNA is generally detectable in upper and lower respiratory specimens during the acute phase of infection. Negative results do not preclude SARS-CoV-2 infection, do not rule out co-infections with other pathogens, and should not be used as the sole basis for treatment or other patient management decisions. Negative results must be combined with clinical observations, patient history, and epidemiological information. The expected result is Negative. Fact Sheet for Patients: Fact Sheet for Healthcare Providers: 05/09/19 This test is not  yet approved or cleared by the [place] FDA and  has been authorized for detection and/or diagnosis of SARS-CoV-2 by FDA under an Emergency Use Authorization (EUA). This EUA will remain  in effect (meaning this test can be used) for the duration of the COVID-19 declaration under Section 56 4(b)(1) of the Act, 21 U.S.C. section 360bbb-3(b)(1), unless the authorization is terminated or revoked sooner. Performed at [facility], [address] [place], [place] [address]       Studies: No results found.  Scheduled Meds: . heparin injection (subcutaneous)  5,000 Units Subcutaneous Q8H  . methylPREDNISolone (SOLU-MEDROL) injection  40 mg Intravenous Daily  . potassium chloride  10 mEq Oral BID PC    Continuous Infusions: . dextrose 5 % with KCl 20 mEq / L 20 mEq (05/09/19 0012)     LOS: 2 days     Holly Droparole N Siennah Barrasso, MD [facility] Pager [phone]  If 7PM-7AM, please contact night-coverage [email or web address] Password [email or web address] 05/09/2019, 7:54 AM

## 2019-05-09 NOTE — Progress Notes (Signed)
Feeling much better with NG tube out.  Tolerating clear liquid diet so far.  Sitting up in chair, appears totally comfortable.  Abdomen is soft, nondistended, minimally tympanic.  Plain abdominal films today (personally reviewed) show resolution of small bowel obstruction.  Basic metabolic panel essentially normal.  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  Have switched methylprednisolone to oral prednisone  2.  Have changed IV to saline lock, since the patient is tolerating oral fluids  3.  We will try the patient on a low residue diet starting tomorrow morning; discharge tomorrow evening, or the next day, would be possible if the patient seems to definitely be tolerating solid food.  4.  We will arrange follow-up for the patient in our office.  Cleotis Nipper, M.D. Pager [phone] If no answer or after 5 PM call [phone]

## 2019-05-09 NOTE — Progress Notes (Signed)
[facility]/Trauma Progress Note      Assessment/Plan Crohn's disease - steroids per GI Hx ADHD  Small bowel obstruction likely 2/2 crohn's - Hx of laparotomy ileocecectomy ~ 1996 - Hx SBR ~ 1997 - GI following, steroids per GI - having flatus and BM's. No nausea or vomiting with NGT clamped - AXR this am shows no SBO  FEN: DC NGT, CLD ID:  None DVT:  SCD/heparin Follow up: GI  Plan: DC NGT, CLD and advance diet as tolerated    LOS: 2 days    Subjective: CC: sore throat  No nausea or vomiting with NGT clamped. Having flatus and BM's. No abdominal pain.  Objective: Vital signs in last 24 hours: Temp:  [98 F (36.7 C)-98.3 F (36.8 C)] 98.3 F (36.8 C) (12/16 0526) Pulse Rate:  [67-72] 72 (12/16 0526) Resp:  [15-18] 15 (12/16 0526) BP: (121-128)/(69-80) 128/80 (12/16 0526) SpO2:  [96 %-98 %] 96 % (12/16 0526) Last BM Date: 05/08/19  Intake/Output from previous day: 12/15 0701 - 12/16 0700 In: 1496.4 [I.V.:1496.4] Out: -  Intake/Output this shift: No intake/output data recorded.  PE:  Gen:  Alert, NAD, pleasant, cooperative Pulm:  Rate and effort normal Abd: Soft, NT/ND, +BS Skin: no rashes noted, warm and dry   Anti-infectives: Anti-infectives (From admission, onward)   None      Lab Results:  Recent Labs    05/07/19 0634 05/08/19 1059  WBC 16.5* 14.1*  HGB 12.1 11.2*  HCT 35.9* 34.0*  PLT 329 356   BMET Recent Labs    05/08/19 1059 05/09/19 0245  NA 140 142  K 3.0* 3.6  CL 97* 102  CO2 34* 30  GLUCOSE 111* 110*  BUN 12 8  CREATININE 0.71 0.75  CALCIUM 8.3* 8.5*   PT/INR No results for input(s): LABPROT, INR in the last 72 hours. CMP     Component Value Date/Time   NA 142 05/09/2019 0245   K 3.6 05/09/2019 0245   CL 102 05/09/2019 0245   CO2 30 05/09/2019 0245   GLUCOSE 110 (H) 05/09/2019 0245   BUN 8 05/09/2019 0245   CREATININE 0.75 05/09/2019 0245   CALCIUM 8.5 (L) 05/09/2019 0245   PROT 6.2 (L)  05/07/2019 0634   ALBUMIN 2.9 (L) 05/07/2019 0634   AST 14 (L) 05/07/2019 0634   ALT 12 05/07/2019 0634   ALKPHOS 69 05/07/2019 0634   BILITOT 0.3 05/07/2019 0634   GFRNONAA >60 05/09/2019 0245   GFRAA >60 05/09/2019 0245   Lipase     Component Value Date/Time   LIPASE 21 05/06/2019 2223    Studies/Results: No results found.   Kalman Drape, PA-C [facility] Please see amion for pager for the following: Myna Hidalgo, W, & Friday 7:00am - 4:30pm Thursdays 7:00am -11:30am

## 2019-05-10 LAB — BASIC METABOLIC PANEL
Anion gap: 12 (ref 5–15)
BUN: 7 mg/dL (ref 6–20)
CO2: 29 mmol/L (ref 22–32)
Calcium: 8.4 mg/dL — ABNORMAL LOW (ref 8.9–10.3)
Chloride: 102 mmol/L (ref 98–111)
Creatinine, Ser: 0.77 mg/dL (ref 0.44–1.00)
GFR calc Af Amer: >60 mL/min (ref >60)
GFR calc non Af Amer: >60 mL/min (ref >60)
Glucose, Bld: 95 mg/dL (ref 70–99)
Potassium: 3.2 mmol/L — ABNORMAL LOW (ref 3.5–5.1)
Sodium: 143 mmol/L (ref 135–145)

## 2019-05-10 LAB — CBC
HCT: 31.8 % — ABNORMAL LOW (ref 36.0–46.0)
Hemoglobin: 10.6 g/dL — ABNORMAL LOW (ref 12.0–15.0)
MCH: 29.7 pg (ref 26.0–34.0)
MCHC: 33.3 g/dL (ref 30.0–36.0)
MCV: 89.1 fL (ref 80.0–100.0)
Platelets: 344 10*3/uL (ref 150–400)
RBC: 3.57 MIL/uL — ABNORMAL LOW (ref 3.87–5.11)
RDW: 12.8 % (ref 11.5–15.5)
WBC: 11.2 10*3/uL — ABNORMAL HIGH (ref 4.0–10.5)
nRBC: 0 % (ref 0.0–0.2)

## 2019-05-10 MED ORDER — PREDNISONE 10 MG PO TABS: 40.0000 mg | ORAL_TABLET | Freq: Every day | ORAL | 0 refills | Status: AC

## 2019-05-10 MED ORDER — PANTOPRAZOLE SODIUM 40 MG PO TBEC: 40.0000 mg | DELAYED_RELEASE_TABLET | Freq: Every day | ORAL | 0 refills | Status: AC

## 2019-05-10 MED FILL — predniSONE 10 MG TABS: 10 | 10 days supply | Qty: 40 | Fill #0

## 2019-05-10 MED FILL — PANTOPRAZOLE SOD DR 40 MG T: 40 | 30 days supply | Qty: 30 | Fill #0

## 2019-05-10 NOTE — Progress Notes (Signed)
Patient ate a full breakfast this morning and feels fine, very much ready to go home.  Abdomen is soft and nontender.  Impression: Clinical and radiographic resolution of small bowel obstruction on medical therapy (suspect residual underlying stenosis at anastomosis).  Recommendations:  1.  Okay for discharge if patient tolerates lunch 2.  Have made follow-up appointment with me for December 22 at 3:30 PM; I have given her my card and instructions to my office 3.  In the meantime, would treat with prednisone 40 mg each morning 4.  Patient instructed to avoid smoking and to follow a low residue diet to diminish likelihood of mechanical plugging of her (presumed) anastomotic stenosis  Cleotis Nipper, M.D. Pager [phone] If no answer or after 5 PM call [phone]

## 2019-05-10 NOTE — Progress Notes (Signed)
Patient discharged to home with instructions and medications. 

## 2019-05-10 NOTE — Discharge Summary (Signed)
Physician Discharge Summary  MEHGAN SANTMYER WJX:[id number] DOB: Mar 03, 1966 DOA: 05/06/2019  PCP: No primary care provider on file.  Admit date: 05/06/2019 Discharge date: 05/10/2019  Admitted From: home Disposition:  home  Recommendations for Outpatient Follow-up:  1. Follow up with PCP in 1-2 weeks 2. Please obtain BMP/CBC in one week 3. Please follow up on the following pending results:  Home Health:none  Equipment/Devices:none   Discharge Condition:stable  CODE STATUS:full  Diet recommendation: low residual  Brief/Interim Summary: HPI/Recap of past 24 hours: Deziree A Luckis a 53 y.o.femalewithhistory of Crohn's disease status post resection about 25 years ago exact details of her surgery is not clear we do not have any access to her records likely in [facility] presents to the ER because of abdominal paindue to SBO. Seen by surgery and GI, she has been NPO with IVF and K+ replacement.  Had NG clamped and was started on ice chips  Which she has tolerated well.  She has had a BM, last one on 05/08/19.  On steroids per GI.  05/10/19: een and examined.  She has been walking around the unit as recommended to improve her SBO.  Positive flatus.  Last BM was yesterday.  Tolerating ice chips.  Denies any abdominal discomfort at this time.  NG tube clamped.  Abdominal x-ray done and pending.  Assessment/Plan: Principal Problem:   SBO (small bowel obstruction) (HCC) Active Problems:   Hypokalemia   SBO in the setting of Crohn's disease status post resection about 25 years ago Seen by general surgery and GI. Improving with positive flatus and BM Tolerating ice chips.  Clamped NG tube. Goal potassium 4.0 Abdominal x-ray done and pending Continue IV fluid with potassium supplement x1 day Continue daily BMP to monitor electrolytes Progressed to low residual diet, which she tolerated. Will discharge to home.   Crohn's disease Initiated on IV Solu-Medrol, transitioned to  oral prednisone, which she will continue until she follows up with GI. Start IV Protonix once daily for GI prophylaxis Has follow up with GI in 5 days.  Leukocytosis, likely reactive in the setting of SBO and steroid use CBC stable. No evidence of infection.  Resolved hypokalemia post repletion Continue to monitor with daily BMPs Last magnesium level 2.2 on 05/08/2019.  Discharge Diagnoses:  Principal Problem:   SBO (small bowel obstruction) (HCC) Active Problems:   Hypokalemia    Discharge Instructions  Discharge Instructions    Call MD for:  difficulty breathing, headache or visual disturbances   Complete by: As directed    Call MD for:  extreme fatigue   Complete by: As directed    Call MD for:  hives   Complete by: As directed    Call MD for:  persistant dizziness or light-headedness   Complete by: As directed    Call MD for:  persistant nausea and vomiting   Complete by: As directed    Call MD for:  redness, tenderness, or signs of infection (pain, swelling, redness, odor or green/yellow discharge around incision site)   Complete by: As directed    Call MD for:  severe uncontrolled pain   Complete by: As directed    Diet - low sodium heart healthy   Complete by: As directed    Increase activity slowly   Complete by: As directed      Allergies as of 05/10/2019   No Known Allergies     Medication List    TAKE these medications   predniSONE 10 MG  tablet Commonly known as: DELTASONE Take 4 tablets (40 mg total) by mouth daily with breakfast for 10 days. Start taking on: May 11, 2019      Follow-up Information    Bernette Redbird, MD Follow up on 05/15/2019.   Specialty: Gastroenterology Contact information: [address] [place] [address] [phone]          No Known Allergies  Consultations:  GI  General surgery   Procedures/Studies: CT ABDOMEN PELVIS W CONTRAST  Result Date: 05/07/2019 CLINICAL DATA:   Abdominal pain. History of Crohn's disease and abdominal surgery. EXAM: CT ABDOMEN AND PELVIS WITH CONTRAST TECHNIQUE: Multidetector CT imaging of the abdomen and pelvis was performed using the standard protocol following bolus administration of intravenous contrast. CONTRAST:  OMNIPAQUE IOHEXOL 300 MG/ML  SOLN COMPARISON:  August 12, 2012 FINDINGS: Lower chest: The lung bases are clear. The heart size is normal. Hepatobiliary: The liver is normal. Normal gallbladder.There is no biliary ductal dilation. Pancreas: Normal contours without ductal dilatation. No peripancreatic fluid collection. Spleen: No splenic laceration or hematoma. Adrenals/Urinary Tract: --Adrenal glands: No adrenal hemorrhage. --Right kidney/ureter: No hydronephrosis or perinephric hematoma. --Left kidney/ureter: No hydronephrosis or perinephric hematoma. --Urinary bladder: Unremarkable. Stomach/Bowel: --Stomach/Duodenum: No hiatal hernia or other gastric abnormality. Normal duodenal course and caliber. --Small bowel: There are multiple dilated loops of small bowel measuring up to approximately 5.2 cm. There is a transition point in the midline abdomen at the level of the patient's neoterminal ileum. At this level, there is diffuse small bowel wall thickening with adjacent inflammation. --Colon: No focal abnormality. --Appendix: Surgically absent. Vascular/Lymphatic: Atherosclerotic calcification is present within the non-aneurysmal abdominal aorta, without hemodynamically significant stenosis. --No retroperitoneal lymphadenopathy. --No mesenteric lymphadenopathy. --No pelvic or inguinal lymphadenopathy. Reproductive: Unremarkable Other: No ascites or free air. The abdominal wall is normal. Musculoskeletal. No acute displaced fractures. IMPRESSION: Small-bowel obstruction with transition point at the level of the patient's neoterminal ileum. At this level, there is diffuse small bowel wall thickening with adjacent inflammation, consistent  with active Crohn's disease. Aortic Atherosclerosis (ICD10-I70.0). Electronically Signed   By: Katherine Mantle M.D.   On: 05/07/2019 01:23   DG Abd 2 Views  Result Date: 05/09/2019 CLINICAL DATA:  Small-bowel obstruction.  Abdominal pain. EXAM: ABDOMEN - 2 VIEW COMPARISON:  05/07/2019 FINDINGS: Orogastric or nasogastric tube enters the stomach with the tip in the descending duodenum. No dilated loops of bowel are seen. No significant air-fluid levels. No free air. IMPRESSION: Orogastric tube in place. No dilated loops of bowel visible presently. Electronically Signed   By: Paulina Fusi M.D.   On: 05/09/2019 09:30   DG Abd Portable 1 View  Result Date: 05/07/2019 CLINICAL DATA:  Initial evaluation for NG tube placement. EXAM: PORTABLE ABDOMEN - 1 VIEW COMPARISON:  Prior CT from earlier the same day. FINDINGS: Enteric tube in place, seen coiled within the stomach, tip overlying the gastric antrum. Side hole well beyond the GE junction. Secreted IV contrast material seen within the renal collecting systems bilaterally. IMPRESSION: Enteric tube in place, tip overlying the gastric antrum. Side hole well beyond the GE junction. Electronically Signed   By: Rise Mu M.D.   On: 05/07/2019 03:00       Subjective:   Discharge Exam: Vitals:   05/09/19 2119 05/10/19 0419  BP: 125/68 118/68  Pulse: 61 (!) 58  Resp: 16 17  Temp: 98.5 F (36.9 C) 98.6 F (37 C)  SpO2: 98% 97%   Vitals:   05/09/19  1324 05/09/19 1422 05/09/19 2119 05/10/19 0419  BP: 128/80 102/69 125/68 118/68  Pulse: 72 69 61 (!) 58  Resp: 15 18 16 17   Temp: 98.3 F (36.8 C) 98.4 F (36.9 C) 98.5 F (36.9 C) 98.6 F (37 C)  TempSrc:  Oral Oral Oral  SpO2: 96% 96% 98% 97%  Weight:         General: Pt is alert, awake, not in acute distress Cardiovascular: RRR, S1/S2 +, no rubs, no gallops Respiratory: CTA bilaterally, no wheezing, no rhonchi Abdominal: Soft, NT, ND, bowel sounds + Extremities: no edema,  no cyanosis    The results of significant diagnostics from this hospitalization (including imaging, microbiology, ancillary and laboratory) are listed below for reference.     Microbiology: Recent Results (from the past 240 hour(s))  SARS CORONAVIRUS 2 (TAT 6-24 HRS) Nasopharyngeal Nasopharyngeal Swab     Status: None   Collection Time: 05/07/19  2:01 AM   Specimen: Nasopharyngeal Swab  Result Value Ref Range Status   SARS Coronavirus 2 NEGATIVE NEGATIVE Final    Comment: (NOTE) SARS-CoV-2 target nucleic acids are NOT DETECTED. The SARS-CoV-2 RNA is generally detectable in upper and lower respiratory specimens during the acute phase of infection. Negative results do not preclude SARS-CoV-2 infection, do not rule out co-infections with other pathogens, and should not be used as the sole basis for treatment or other patient management decisions. Negative results must be combined with clinical observations, patient history, and epidemiological information. The expected result is Negative. Fact Sheet for Patients: SugarRoll.be Fact Sheet for Healthcare Providers: https://www.woods-mathews.com/ This test is not yet approved or cleared by the [place] FDA and  has been authorized for detection and/or diagnosis of SARS-CoV-2 by FDA under an Emergency Use Authorization (EUA). This EUA will remain  in effect (meaning this test can be used) for the duration of the COVID-19 declaration under Section 56 4(b)(1) of the Act, 21 U.S.C. section 360bbb-3(b)(1), unless the authorization is terminated or revoked sooner. Performed at [facility], [place] [address], [place], [place] [address]      Labs: BNP (last 3 results) No results for input(s): BNP in the last 8760 hours. Basic Metabolic Panel: Recent Labs  Lab 05/06/19 2223 05/07/19 0305 05/07/19 0634 05/08/19 1059 05/09/19 0245 05/10/19 0134  NA 139  --  141 140 142 143  K 2.8*   --  3.4* 3.0* 3.6 3.2*  CL 104  --  105 97* 102 102  CO2 22  --  24 34* 30 29  GLUCOSE 158*  --  174* 111* 110* 95  BUN 13  --  16 12 8 7   CREATININE 0.73 0.63 0.79 0.71 0.75 0.77  CALCIUM 8.4*  --  8.0* 8.3* 8.5* 8.4*  MG  --   --  2.1 2.2  --   --    Liver Function Tests: Recent Labs  Lab 05/06/19 2223 05/07/19 0634  AST 17 14*  ALT 12 12  ALKPHOS 70 69  BILITOT 1.0 0.3  PROT 6.8 6.2*  ALBUMIN 3.3* 2.9*   Recent Labs  Lab 05/06/19 2223  LIPASE 21   No results for input(s): AMMONIA in the last 168 hours. CBC: Recent Labs  Lab 05/06/19 2223 05/07/19 0305 05/07/19 0634 05/08/19 1059 05/10/19 0134  WBC 24.8* 18.4* 16.5* 14.1* 11.2*  NEUTROABS  --   --  15.7*  --   --   HGB 13.1 12.1 12.1 11.2* 10.6*  HCT 39.9 36.1 35.9* 34.0* 31.8*  MCV 90.9 90.3 88.2 90.2 89.1  PLT 372 344 329 356 344   Cardiac Enzymes: No results for input(s): CKTOTAL, CKMB, CKMBINDEX, TROPONINI in the last 168 hours. BNP: Invalid input(s): POCBNP CBG: Recent Labs  Lab 05/08/19 1800 05/09/19 0013 05/09/19 0532 05/09/19 1205 05/09/19 1828  GLUCAP 150* 99 91 103* 148*   D-Dimer No results for input(s): DDIMER in the last 72 hours. Hgb A1c No results for input(s): HGBA1C in the last 72 hours. Lipid Profile No results for input(s): CHOL, HDL, LDLCALC, TRIG, CHOLHDL, LDLDIRECT in the last 72 hours. Thyroid function studies No results for input(s): TSH, T4TOTAL, T3FREE, THYROIDAB in the last 72 hours.  Invalid input(s): FREET3 Anemia work up No results for input(s): VITAMINB12, FOLATE, FERRITIN, TIBC, IRON, RETICCTPCT in the last 72 hours. Urinalysis    Component Value Date/Time   COLORURINE YELLOW 05/07/2019 0657   APPEARANCEUR CLEAR 05/07/2019 0657   LABSPEC >1.046 (H) 05/07/2019 0657   PHURINE 5.0 05/07/2019 0657   GLUCOSEU NEGATIVE 05/07/2019 0657   HGBUR NEGATIVE 05/07/2019 0657   BILIRUBINUR NEGATIVE 05/07/2019 0657   KETONESUR 20 (A) 05/07/2019 0657   PROTEINUR 30 (A)  05/07/2019 0657   NITRITE NEGATIVE 05/07/2019 0657   LEUKOCYTESUR TRACE (A) 05/07/2019 0657   Sepsis Labs Invalid input(s): PROCALCITONIN,  WBC,  LACTICIDVEN Microbiology Recent Results (from the past 240 hour(s))  SARS CORONAVIRUS 2 (TAT 6-24 HRS) Nasopharyngeal Nasopharyngeal Swab     Status: None   Collection Time: 05/07/19  2:01 AM   Specimen: Nasopharyngeal Swab  Result Value Ref Range Status   SARS Coronavirus 2 NEGATIVE NEGATIVE Final    Comment: (NOTE) SARS-CoV-2 target nucleic acids are NOT DETECTED. The SARS-CoV-2 RNA is generally detectable in upper and lower respiratory specimens during the acute phase of infection. Negative results do not preclude SARS-CoV-2 infection, do not rule out co-infections with other pathogens, and should not be used as the sole basis for treatment or other patient management decisions. Negative results must be combined with clinical observations, patient history, and epidemiological information. The expected result is Negative. Fact Sheet for Patients: HairSlick.nohttps://www.fda.gov/media/138098/download Fact Sheet for Healthcare Providers: quierodirigir.comhttps://www.fda.gov/media/138095/download This test is not yet approved or cleared by the [place] FDA and  has been authorized for detection and/or diagnosis of SARS-CoV-2 by FDA under an Emergency Use Authorization (EUA). This EUA will remain  in effect (meaning this test can be used) for the duration of the COVID-19 declaration under Section 56 4(b)(1) of the Act, 21 U.S.C. section 360bbb-3(b)(1), unless the authorization is terminated or revoked sooner. Performed at [facility], [address] [place], [place] [id number]      Time coordinating discharge: Over 30 minutes  SIGNED:   Levie HeritageJacob J Rane Blitch, DO [facility] 05/10/2019, 12:01 PM

## 2019-05-10 NOTE — Final Consult Note (Signed)
Consultant Final Sign-Off Note    Assessment/Final recommendations  Holly Stevens is a 53 y.o. female followed by me for SBO likely secondary to active Crohns. SBO has resolved, she is tolerating soft diet and having bowel function. [place] for discharge from surgical standpoint. Prednisone per GI. Follow up with GI, no surgical follow up needed.   Wound care (if applicable): n/a   Diet at discharge: per primary team   Activity at discharge: per primary team   Follow-up appointment:  Follow up with GI, no surgical follow up needed   Pending results:  Unresulted Labs (From admission, onward)    Start     Ordered   05/09/19 0500  Daily BMP  Daily,   R     05/08/19 0941           Medication recommendations: per GI   Other recommendations:    Thank you for allowing [place] to participate in the care of your patient!  Please consult [place] again if you have further needs for your patient.  Legacy Lacivita A Marylin Lathon 05/10/2019 7:57 AM    Subjective  Feeling well this morning. Tolerating soft diet. Denies n/v. Passing flatus. BM x2 yesterday and another this AM.   Objective  Vital signs in last 24 hours: Temp:  [98.4 F (36.9 C)-98.6 F (37 C)] 98.6 F (37 C) (12/17 0419) Pulse Rate:  [58-69] 58 (12/17 0419) Resp:  [16-18] 17 (12/17 0419) BP: (102-125)/(68-69) 118/68 (12/17 0419) SpO2:  [96 %-98 %] 97 % (12/17 0419)  Gen:  Alert, NAD, pleasant, cooperative HEENT: pupils equal and round Pulm:  Rate and effort normal Abd: Soft, NT/ND, +BS Skin: no rashes noted, warm and dry Neuro: moving all 4 extremities  Pertinent labs and Studies: Recent Labs    05/08/19 1059 05/10/19 0134  WBC 14.1* 11.2*  HGB 11.2* 10.6*  HCT 34.0* 31.8*   BMET Recent Labs    05/09/19 0245 05/10/19 0134  NA 142 143  K 3.6 3.2*  CL 102 102  CO2 30 29  GLUCOSE 110* 95  BUN 8 7  CREATININE 0.75 0.77  CALCIUM 8.5* 8.4*   No results for input(s): LABURIN in the last 72 hours. Results for orders  placed or performed during the hospital encounter of 05/06/19  SARS CORONAVIRUS 2 (TAT 6-24 HRS) Nasopharyngeal Nasopharyngeal Swab     Status: None   Collection Time: 05/07/19  2:01 AM   Specimen: Nasopharyngeal Swab  Result Value Ref Range Status   SARS Coronavirus 2 NEGATIVE NEGATIVE Final    Comment: (NOTE) SARS-CoV-2 target nucleic acids are NOT DETECTED. The SARS-CoV-2 RNA is generally detectable in upper and lower respiratory specimens during the acute phase of infection. Negative results do not preclude SARS-CoV-2 infection, do not rule out co-infections with other pathogens, and should not be used as the sole basis for treatment or other patient management decisions. Negative results must be combined with clinical observations, patient history, and epidemiological information. The expected result is Negative. Fact Sheet for Patients: SugarRoll.be Fact Sheet for Healthcare Providers: https://www.woods-mathews.com/ This test is not yet approved or cleared by the [place] FDA and  has been authorized for detection and/or diagnosis of SARS-CoV-2 by FDA under an Emergency Use Authorization (EUA). This EUA will remain  in effect (meaning this test can be used) for the duration of the COVID-19 declaration under Section 56 4(b)(1) of the Act, 21 U.S.C. section 360bbb-3(b)(1), unless the authorization is terminated or revoked sooner. Performed at [facility],  [address] [place], [place] [address]     Imaging: No results found.

## 2019-05-10 NOTE — Progress Notes (Signed)
Patient for discharge today and she mentioned that she was wearing a white shirt when she was admitted to the ED, she was told to remove her shirt so she can wear her hospital gown; so then she can't remember where it is now since she was in a lot of pain at that time. I called ED and asked if they have a lost and found shirt but they don't have anything, and told me to call security which I did, but security don't keep clothing items.

## 2021-01-04 IMAGING — CT CT ABD-PELV W/ CM
2 of 5 series · 16 of 46 positions shown, 18 images · IV contrast (Omni 300)
Comparison: August 12, 2012

CLINICAL DATA: Abdominal pain. History of Crohn's disease and
abdominal surgery.

EXAM:
CT ABDOMEN AND PELVIS WITH CONTRAST
TECHNIQUE: Multidetector CT imaging of the abdomen and pelvis was performed
using the standard protocol following bolus administration of
intravenous contrast.
CONTRAST:  100mL OMNIPAQUE IOHEXOL 300 MG/ML  SOLN

[Series 3: a/p w/ 5mm · axial · 0.85mm/px · z∈[+804,+1204]mm · 13 of 90 slices shown, 15 images]
[im 5/90  soft-tissue]
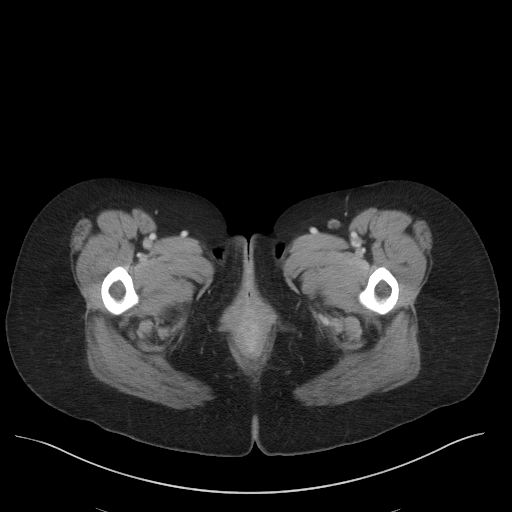
[im 5/90  bone]
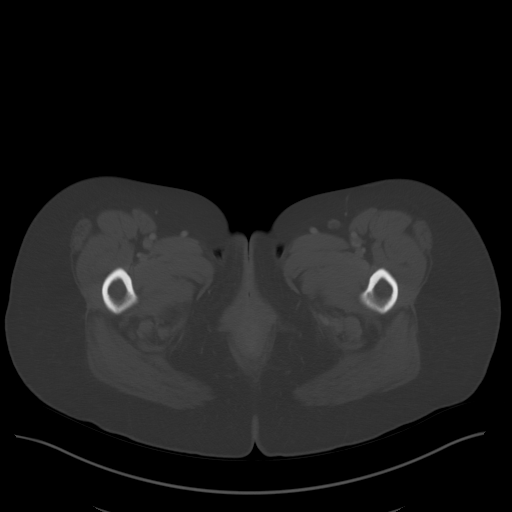
[im 14/90  soft-tissue]
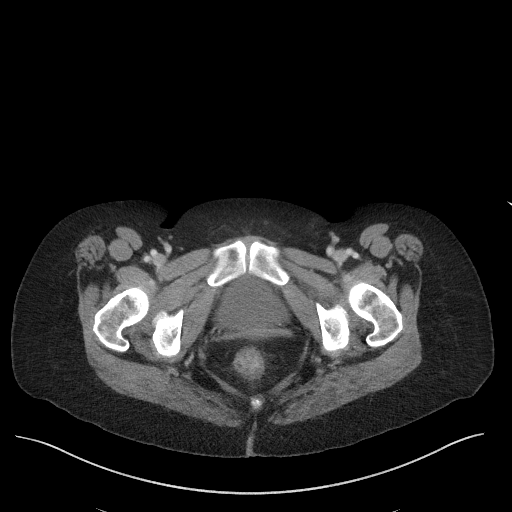
[im 18/90  soft-tissue]
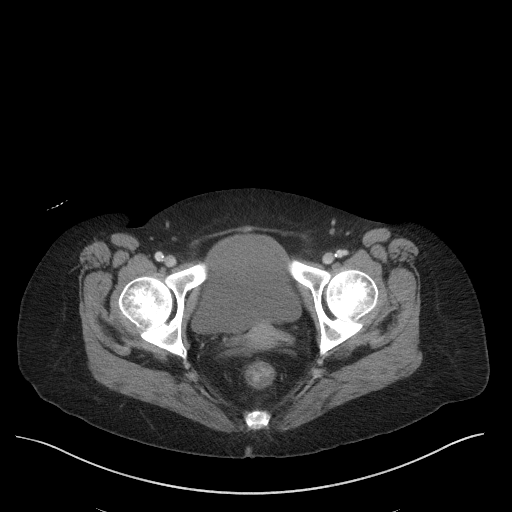
[im 27/90  soft-tissue]
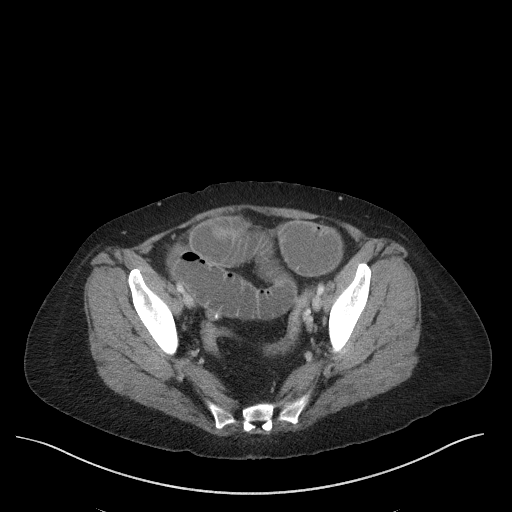
[im 32/90  soft-tissue]
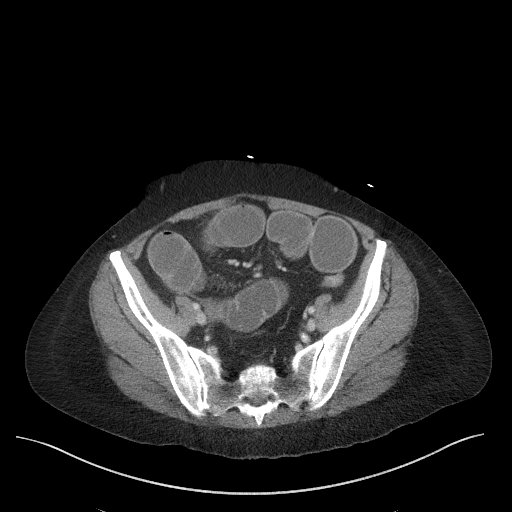
[im 41/90  soft-tissue]
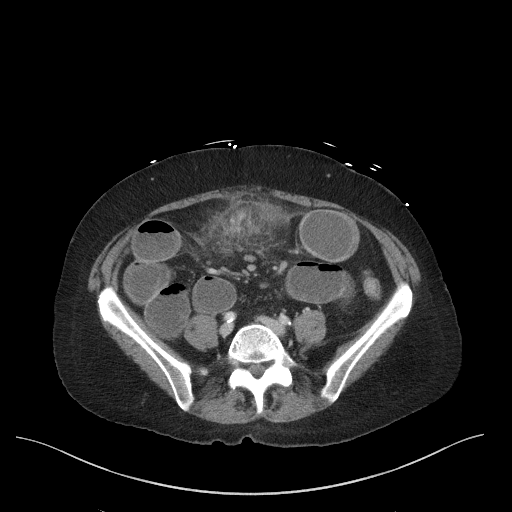
[im 45/90  soft-tissue]
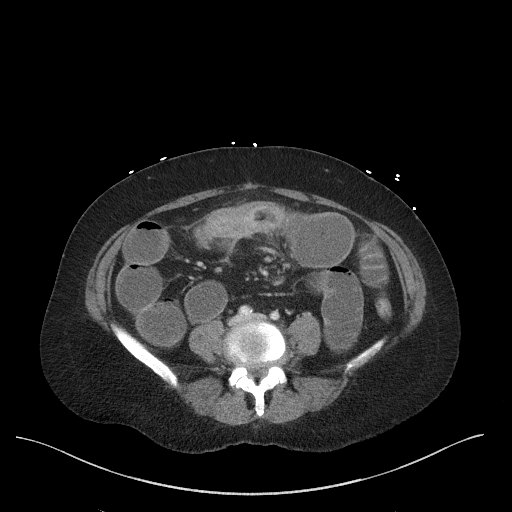
[im 49/90  soft-tissue]
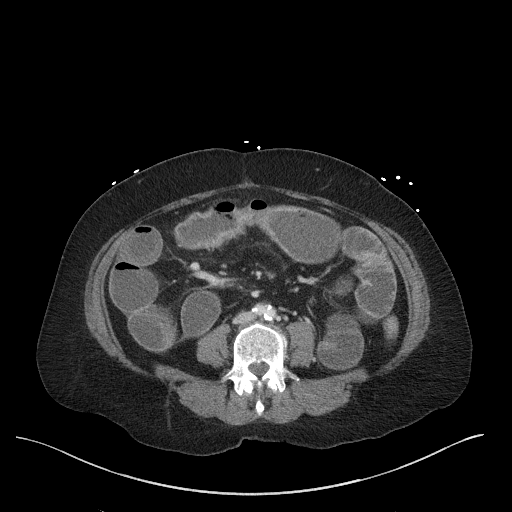
[im 58/90  soft-tissue]
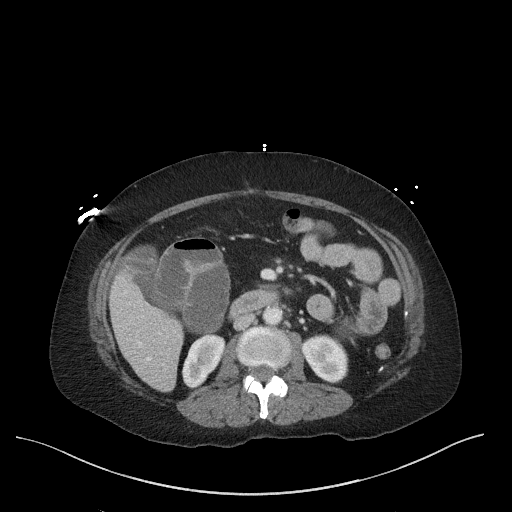
[im 58/90  bone]
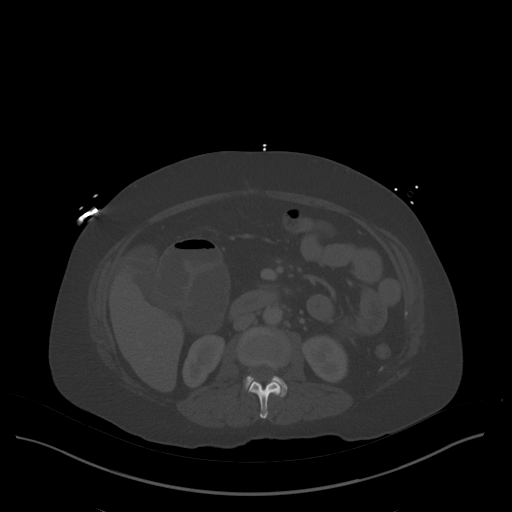
[im 63/90  soft-tissue]
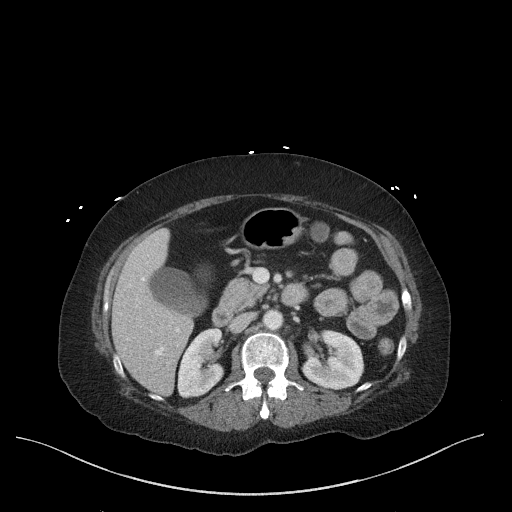
[im 72/90  soft-tissue]
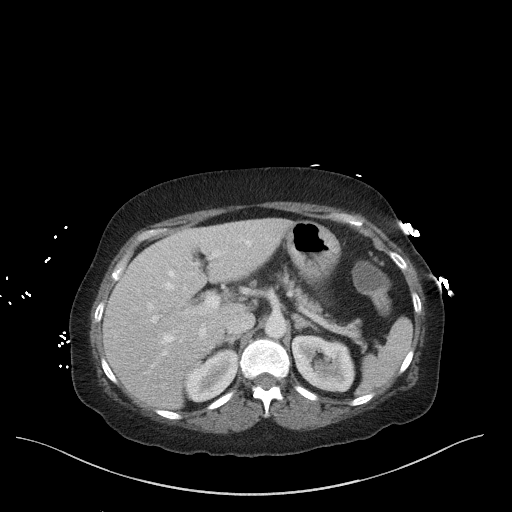
[im 76/90  soft-tissue]
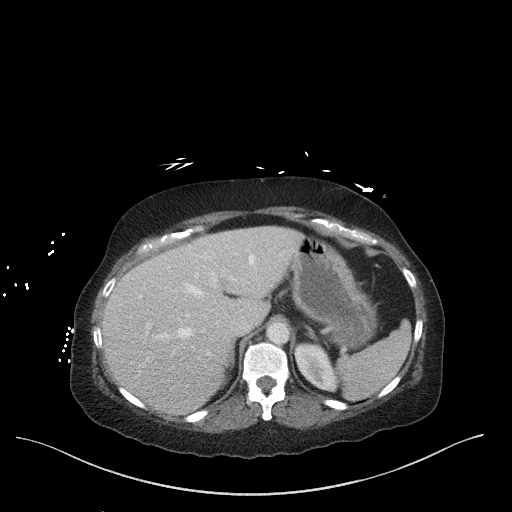
[im 85/90  soft-tissue]
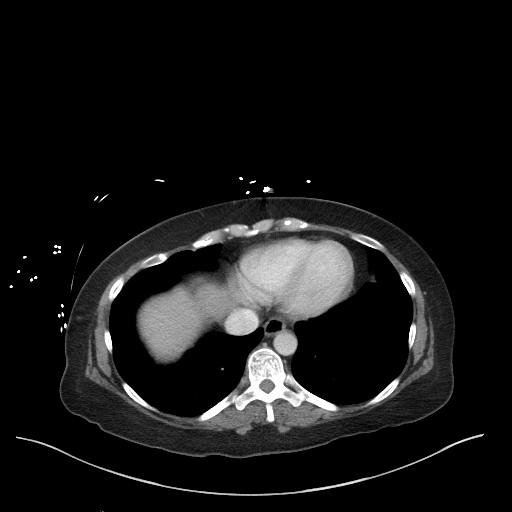

[Series 6: a/p w/ cor · coronal · 0.69mm/px · 3 of 122 slices shown]
[im 41/122  soft-tissue]
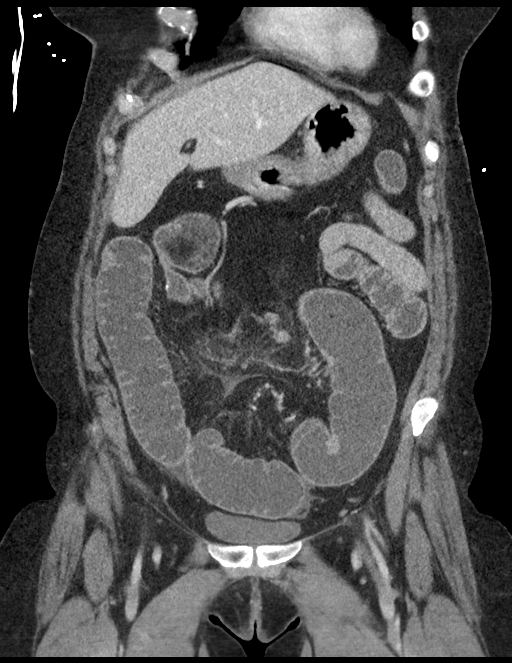
[im 54/122  soft-tissue]
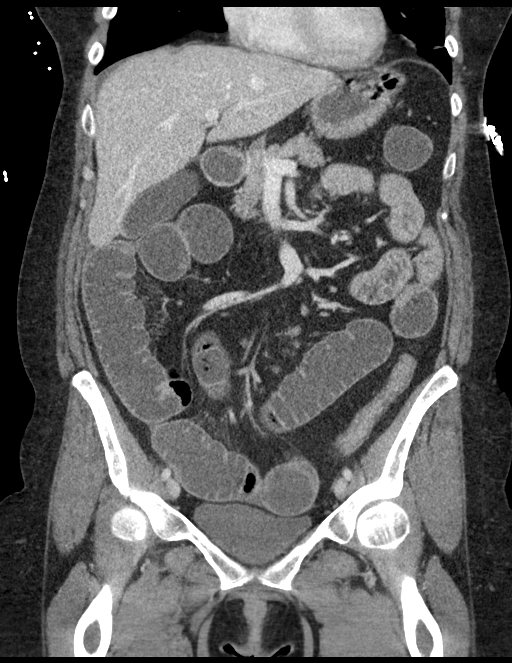
[im 68/122  soft-tissue]
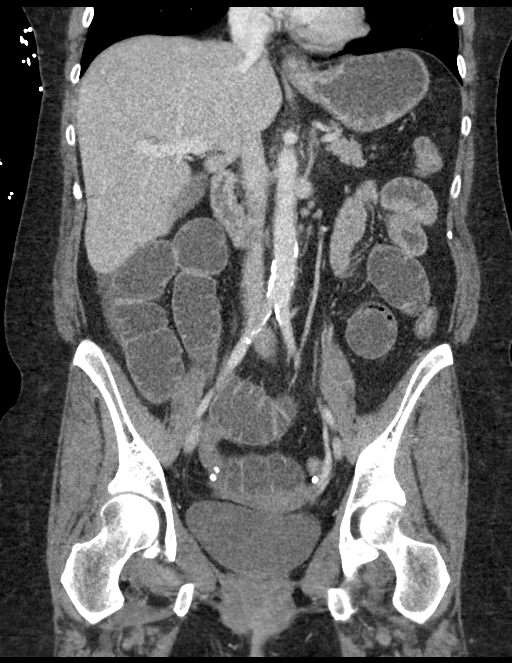

[16 of 46 positions shown; findings below may reference images not displayed]

FINDINGS: Lower chest: The lung bases are clear. The heart size is normal.

Hepatobiliary: The liver is normal. Normal gallbladder.There is no
biliary ductal dilation.

Pancreas: Normal contours without ductal dilatation. No
peripancreatic fluid collection.

Spleen: No splenic laceration or hematoma.

Adrenals/Urinary Tract:

--Adrenal glands: No adrenal hemorrhage.

--Right kidney/ureter: No hydronephrosis or perinephric hematoma.

--Left kidney/ureter: No hydronephrosis or perinephric hematoma.

--Urinary bladder: Unremarkable.

Stomach/Bowel:

--Stomach/Duodenum: No hiatal hernia or other gastric abnormality.
Normal duodenal course and caliber.

--Small bowel: There are multiple dilated loops of small bowel
measuring up to approximately 5.2 cm. There is a transition point in
the midline abdomen at the level of the patient's neoterminal ileum.
At this level, there is diffuse small bowel wall thickening with
adjacent inflammation.

--Colon: No focal abnormality.

--Appendix: Surgically absent.

Vascular/Lymphatic: Atherosclerotic calcification is present within
the non-aneurysmal abdominal aorta, without hemodynamically
significant stenosis.

--No retroperitoneal lymphadenopathy.

--No mesenteric lymphadenopathy.

--No pelvic or inguinal lymphadenopathy.

Reproductive: Unremarkable

Other: No ascites or free air. The abdominal wall is normal.

Musculoskeletal. No acute displaced fractures.
IMPRESSION: Small-bowel obstruction with transition point at the level of the
patient's neoterminal ileum. At this level, there is diffuse small
bowel wall thickening with adjacent inflammation, consistent with
active Crohn's disease.

Aortic Atherosclerosis ([id number]).

## 2023-06-29 ENCOUNTER — Other Ambulatory Visit: Payer: Self-pay | Admitting: Gastroenterology

## 2023-06-29 DIAGNOSIS — K50018 Crohn's disease of small intestine with other complication: Secondary | ICD-10-CM

## 2023-07-20 ENCOUNTER — Encounter: Payer: Self-pay | Admitting: Gastroenterology

## 2023-07-29 ENCOUNTER — Ambulatory Visit
Admission: RE | Admit: 2023-07-29 | Discharge: 2023-07-29 | Disposition: A | Discharge status: Home / Self Care | Payer: Self-pay | Source: Ambulatory Visit | Attending: Gastroenterology | Admitting: Gastroenterology

## 2023-07-29 DIAGNOSIS — K50018 Crohn's disease of small intestine with other complication: Secondary | ICD-10-CM

## 2023-07-29 MED ORDER — IOPAMIDOL (ISOVUE-300) INJECTION 61%
100.0000 mL | Freq: Once | INTRAVENOUS | Status: AC | PRN
  Administered 2023-07-29: 100 mL via INTRAVENOUS
